# Patient Record
Sex: Female | Born: 1985 | Race: White | Hispanic: No | Marital: Married | Smoking: Never smoker
Health system: Southern US, Community
[De-identification: ages and names within clinical notes are randomized; demographics above are authoritative.]

## PROBLEM LIST (undated history)

## (undated) ENCOUNTER — Inpatient Hospital Stay (HOSPITAL_COMMUNITY): Payer: Self-pay

## (undated) DIAGNOSIS — R519 Headache, unspecified: Secondary | ICD-10-CM

## (undated) DIAGNOSIS — Z87738 Personal history of other specified (corrected) congenital malformations of digestive system: Secondary | ICD-10-CM

## (undated) DIAGNOSIS — N7013 Chronic salpingitis and oophoritis: Secondary | ICD-10-CM

## (undated) DIAGNOSIS — F32A Depression, unspecified: Secondary | ICD-10-CM

## (undated) DIAGNOSIS — N856 Intrauterine synechiae: Secondary | ICD-10-CM

## (undated) DIAGNOSIS — F329 Major depressive disorder, single episode, unspecified: Secondary | ICD-10-CM

## (undated) DIAGNOSIS — Z8719 Personal history of other diseases of the digestive system: Secondary | ICD-10-CM

## (undated) DIAGNOSIS — R51 Headache: Secondary | ICD-10-CM

## (undated) HISTORY — PX: COLON SURGERY: SHX602

---

## 2013-01-03 ENCOUNTER — Encounter (HOSPITAL_COMMUNITY): Payer: Self-pay | Admitting: Emergency Medicine

## 2013-01-03 ENCOUNTER — Emergency Department (INDEPENDENT_AMBULATORY_CARE_PROVIDER_SITE_OTHER)
Admission: EM | Admit: 2013-01-03 | Discharge: 2013-01-03 | Disposition: A | Discharge status: Home / Self Care | Payer: BC Managed Care – PPO | Source: Home / Self Care

## 2013-01-03 DIAGNOSIS — B349 Viral infection, unspecified: Secondary | ICD-10-CM

## 2013-01-03 DIAGNOSIS — I951 Orthostatic hypotension: Secondary | ICD-10-CM

## 2013-01-03 DIAGNOSIS — B9789 Other viral agents as the cause of diseases classified elsewhere: Secondary | ICD-10-CM

## 2013-01-03 LAB — CBC
Hemoglobin: 14.2 g/dL (ref 12.0–15.0)
MCH: 32.3 pg (ref 26.0–34.0)
MCHC: 35.4 g/dL (ref 30.0–36.0)
Platelets: 176 10*3/uL (ref 150–400)
RBC: 4.39 MIL/uL (ref 3.87–5.11)

## 2013-01-03 LAB — POCT INFECTIOUS MONO SCREEN: Mono Screen: NEGATIVE

## 2013-01-03 MED ORDER — SODIUM CHLORIDE 0.9 % IV BOLUS (SEPSIS): 1000.0000 mL | Freq: Once | INTRAVENOUS | Status: DC

## 2013-01-03 NOTE — ED Notes (Signed)
I-stat results not crossing over results given to Dr Denyse Amass

## 2013-01-03 NOTE — ED Provider Notes (Signed)
Audrey Vega is a 27 y.o. female who presents to Urgent Care today for sore throat fatigue and bilateral ear pain starting this morning. Patient had some mild sore throat starting yesterday. She denies any chest pain or trouble breathing nausea vomiting or diarrhea. She feels well otherwise. She denies any syncope or dizziness. Positive sick contacts at work. No tick bites.    PMH reviewed. Reflux History  Substance Use Topics  . Smoking status: Not on file  . Smokeless tobacco: Not on file  . Alcohol Use: Not on file   ROS as above Medications reviewed. Current Facility-Administered Medications  Medication Dose Route Frequency Provider Last Rate Last Dose  . sodium chloride 0.9 % bolus 1,000 mL  1,000 mL Intravenous Once Rodolph Bong, MD       Current Outpatient Prescriptions  Medication Sig Dispense Refill  . RANITIDINE HCL PO Take by mouth.        Exam:  BP 112/77  Pulse 128  Temp(Src) 99.7 F (37.6 C) (Oral)  Resp 18  SpO2 100%  LMP 12/09/2012 Filed Vitals:   01/03/13 1647 01/03/13 1744 01/03/13 1745 01/03/13 1746  BP: 106/73 103/67 106/70 112/77  Pulse: 87 75 105 128  Temp: 99.7 F (37.6 C)     TempSrc: Oral     Resp: 18     SpO2: 100%       Gen: Well NAD HEENT: EOMI,  MMM Lungs: CTABL Nl WOB Heart: RRR no MRG Abd: NABS, NT, ND Exts: Non edematous BL  LE, warm and well perfused.   Results for orders placed during the hospital encounter of 01/03/13 (from the past 24 hour(s))  POCT RAPID STREP A ([facility] ONLY)     Status: None   Collection Time    01/03/13  5:03 PM      Result Value Range   Streptococcus, Group A Screen (Direct) NEGATIVE  NEGATIVE  POCT INFECTIOUS MONO SCREEN     Status: None   Collection Time    01/03/13  5:40 PM      Result Value Range   Mono Screen NEGATIVE  NEGATIVE  CBC     Status: None   Collection Time    01/03/13  5:58 PM      Result Value Range   WBC 7.7  4.0 - 10.5 K/uL   RBC 4.39  3.87 - 5.11 MIL/uL   Hemoglobin  14.2  12.0 - 15.0 g/dL   HCT 78.2  95.6 - 21.3 %   MCV 91.3  78.0 - 100.0 fL   MCH 32.3  26.0 - 34.0 pg   MCHC 35.4  30.0 - 36.0 g/dL   RDW 08.6  57.8 - 46.9 %   Platelets 176  150 - 400 K/uL   No results found.  Assessment and Plan: 27 y.o. female with viral illness and orthostasis.  Unclear etiology.  Patient appears well and her physical exam is within normal limits with the exception of orthostatic vital signs. Her laboratory findings have also been normal. We attempted to provide 1 L of normal saline however unable to start an IV after 3 attempts.  Plan for symptomatic management with Tylenol and oral rehydration.  Followup if not improving Discussed warning signs or symptoms. Please see discharge instructions. Patient expresses understanding.     Rodolph Bong, MD 01/03/13 [phone]

## 2013-01-03 NOTE — ED Notes (Signed)
C/o ear pain and fatigue.   Patient states she feels very weak which started today.  Patient states she has cold symptoms.

## 2013-01-03 NOTE — ED Notes (Signed)
Attempted IV start x 2 W/o success

## 2013-01-05 LAB — CULTURE, GROUP A STREP

## 2013-01-06 LAB — POCT I-STAT, CHEM 8
Creatinine, Ser: 0.9 mg/dL (ref 0.50–1.10)
Hemoglobin: 14.6 g/dL (ref 12.0–15.0)
Potassium: 4.3 mEq/L (ref 3.5–5.1)
Sodium: 140 mEq/L (ref 135–145)
TCO2: 23 mmol/L (ref 0–100)

## 2013-08-16 ENCOUNTER — Encounter (HOSPITAL_COMMUNITY): Payer: Self-pay | Admitting: Emergency Medicine

## 2013-08-16 ENCOUNTER — Emergency Department (HOSPITAL_COMMUNITY)
Admission: EM | Admit: 2013-08-16 | Discharge: 2013-08-16 | Disposition: A | Discharge status: Home / Self Care | Payer: BC Managed Care – PPO | Attending: Emergency Medicine | Admitting: Emergency Medicine

## 2013-08-16 DIAGNOSIS — Y93G1 Activity, food preparation and clean up: Secondary | ICD-10-CM | POA: Insufficient documentation

## 2013-08-16 DIAGNOSIS — T07XXXA Unspecified multiple injuries, initial encounter: Secondary | ICD-10-CM

## 2013-08-16 DIAGNOSIS — Y92009 Unspecified place in unspecified non-institutional (private) residence as the place of occurrence of the external cause: Secondary | ICD-10-CM | POA: Insufficient documentation

## 2013-08-16 DIAGNOSIS — S61209A Unspecified open wound of unspecified finger without damage to nail, initial encounter: Secondary | ICD-10-CM | POA: Insufficient documentation

## 2013-08-16 DIAGNOSIS — W268XXA Contact with other sharp object(s), not elsewhere classified, initial encounter: Secondary | ICD-10-CM | POA: Insufficient documentation

## 2013-08-16 MED ORDER — OXYCODONE-ACETAMINOPHEN 5-325 MG PO TABS
1.0000 | ORAL_TABLET | Freq: Once | ORAL | Status: AC
  Administered 2013-08-16: 1 via ORAL
  Filled 2013-08-16: qty 1

## 2013-08-16 MED ORDER — IBUPROFEN 800 MG PO TABS: 800.0000 mg | ORAL_TABLET | Freq: Three times a day (TID) | ORAL | Status: DC

## 2013-08-16 MED ORDER — BACITRACIN 500 UNIT/GM EX OINT
1.0000 "application " | TOPICAL_OINTMENT | Freq: Two times a day (BID) | CUTANEOUS | Status: DC
  Administered 2013-08-16: 1 via TOPICAL
  Filled 2013-08-16 (×2): qty 0.9

## 2013-08-16 MED ORDER — HYDROCODONE-ACETAMINOPHEN 5-325 MG PO TABS: 1.0000 | ORAL_TABLET | ORAL | Status: DC | PRN

## 2013-08-16 NOTE — ED Provider Notes (Signed)
CSN: [id number]     Arrival date & time 08/16/13  1326 History  This chart was scribed for non-physician practitioner, Mellody DrownLauren Eldean Klatt, PA-C, working with Audrey PoundMichael Y. Oletta LamasGhim, MD by Audrey Vega, ED Scribe. This patient was seen in room WTR5/WTR5 and the patient's care was started at 2:00 PM.    Chief Complaint  Patient presents with  . Extremity Laceration   The history is provided by the patient. No language interpreter was used.   HPI Comments: Audrey Vega is a 28 y.o. female who presents to the Emergency Department complaining of a laceration to her right hand that occurred earlier today.  Pt states she was washing dishes when a glass broke and cut her right hand.  Pt has one laceration at the proximal aspect of right index finger and another laceration to the proximal aspect of the right thumb.  Pt can move her fingers with some pain.  Pt reports her tetanus is UTD.    History reviewed. No pertinent past medical history. No past surgical history on file. No family history on file. History  Substance Use Topics  . Smoking status: Never Smoker   . Smokeless tobacco: Not on file  . Alcohol Use: No   OB History   Grav Para Term Preterm Abortions TAB SAB Ect Mult Living                 Review of Systems  Constitutional: Negative for fever and chills.  Cardiovascular: Negative for chest pain.  Gastrointestinal: Negative for nausea, vomiting, abdominal pain and diarrhea.  Musculoskeletal: Negative for myalgias.  Skin: Positive for wound. Negative for color change and rash.  Psychiatric/Behavioral: Negative for behavioral problems and confusion.   Allergies  Review of patient's allergies indicates no known allergies.  Home Medications   Current Outpatient Rx  Name  Route  Sig  Dispense  Refill  . RANITIDINE HCL PO   Oral   Take by mouth.          Triage Vitals: BP 108/63  Pulse 87  Temp(Src) 98.2 F (36.8 C) (Oral)  Resp 16  SpO2 100%  LMP 08/05/2013  Physical  Exam  Nursing note and vitals reviewed. Constitutional: She is oriented to person, place, and time. She appears well-developed and well-nourished. No distress.  HENT:  Head: Normocephalic and atraumatic.  Eyes: EOM are normal.  Neck: Neck supple. No tracheal deviation present.  Cardiovascular: Normal rate.   Pulmonary/Chest: Effort normal. No respiratory distress.  Abdominal: Soft. She exhibits no distension.  Musculoskeletal: Normal range of motion.  Neurological: She is alert and oriented to person, place, and time.  Sensation and motor intact.    Skin: Skin is warm and dry.  2 cm linear Laceration to the posterior right MCP joint of index finger.  1 cm linear laceration to the posterior right MCP joint of right thumb.  Bleeding controlled.  Good ROM of all fingers.  Good strength.  Tendons intact.  Psychiatric: She has a normal mood and affect. Her behavior is normal.    ED Course  Procedures (including critical care time)  COORDINATION OF CARE: 2:04 PM-Will apply stitches to both areas.  Patient informed of current plan of treatment and evaluation and agrees with plan.    3:09 PM - Will apply steri strips and dressing.  Will order Bacitracin.      LACERATION REPAIR PROCEDURE NOTE The patient's identification was confirmed and consent was obtained. This procedure was performed by Mellody DrownLauren Makinzy Cleere, PA-C at 2:45 PM.  Site: Posterior right MCP joint of 2nd digit Sterile procedures observed: clean Anesthetic used (type and amt): 2% Lidocaine with epi, 2mL Suture type/size: 5-0 Prolene Length: 2 cm  # of Sutures: 3  Technique:simple interrupted Complexity:simple Antibx ointment applied: None Tetanus UTD Site anesthetized (local), irrigated with NS, explored without evidence of foreign body, wound well approximated, site covered with dry, sterile dressing.  Patient tolerated procedure well without complications. Instructions for care discussed verbally and patient provided  with additional written instructions for homecare and f/u.  LACERATION REPAIR PROCEDURE NOTE The patient's identification was confirmed and consent was obtained. This procedure was performed by Mellody Drown, PA-C at 2:45 PM. Site: Posterior right MCP joint of 1st digit Sterile procedures observed:clean Anesthetic used (type and amt): 2% Lidocaine with epi 2 mL Suture type/size:5-0 Prolene Length:1 cm  # of Sutures: 2 Technique:simple interrupted  Complexity: simple Antibx ointment applied: none Tetanus UTD Site anesthetized (local), irrigated with NS, explored without evidence of foreign body, wound well approximated, site covered with dry, sterile dressing.  Patient tolerated procedure well without complications. Instructions for care discussed verbally and patient provided with additional written instructions for homecare and f/u.    MDM   Final diagnoses:  Multiple lacerations   Pt with laceration to right hand. No tendon involvement, NV intact. Bleeding controlled.  Pt tolerated laceration repairs. Discussed treatment plan with the patient, will have follow up for suture removal in 8-10 days. Return precautions given. Reports understanding and no other concerns at this time.  Patient is stable for discharge at this time.   Meds given in ED:  Medications  bacitracin ointment 1 application (not administered)  oxyCODONE-acetaminophen (PERCOCET/ROXICET) 5-325 MG per tablet 1 tablet (1 tablet Oral Given 08/16/13 1515)    New Prescriptions   No medications on file     I personally performed the services described in this documentation, which was scribed in my presence. The recorded information has been reviewed and is accurate.      Audrey Seal, PA-C 08/18/13 [phone]

## 2013-08-16 NOTE — ED Notes (Signed)
She states she lac. Her right hand while washing a drinking glass, which broke.  She has a lac. At post. right mcp joint #1 and a second, somewhat larger one at post. mcp joint #2.  She is in no distress.

## 2013-08-16 NOTE — Discharge Instructions (Signed)
Please go to Dr. Trevor MacePower's office, an Urgent Care or return to the Emergency Department to have your sutures removed in 8-10 days from today. Return if Symptoms worsen.   Take medication as prescribed.  Keep your hand clean and dry.

## 2013-08-19 NOTE — ED Provider Notes (Signed)
Medical screening examination/treatment/procedure(s) were performed by non-physician practitioner and as supervising physician I was immediately available for consultation/collaboration.   EKG Interpretation None        Ahmet Schank Y. Ilisa Hayworth, MD 08/19/13 0835 

## 2013-10-23 ENCOUNTER — Other Ambulatory Visit: Payer: Self-pay | Admitting: Internal Medicine

## 2013-10-23 DIAGNOSIS — R1084 Generalized abdominal pain: Secondary | ICD-10-CM

## 2013-10-29 ENCOUNTER — Inpatient Hospital Stay: Admission: RE | Admit: 2013-10-29 | Payer: BC Managed Care – PPO | Source: Ambulatory Visit

## 2013-12-02 ENCOUNTER — Encounter (HOSPITAL_BASED_OUTPATIENT_CLINIC_OR_DEPARTMENT_OTHER): Payer: Self-pay | Admitting: *Deleted

## 2013-12-02 NOTE — Progress Notes (Signed)
NPO AFTER MN. ARRIVE AT 0930. NEEDS HG AND URINE PREG.  

## 2013-12-09 ENCOUNTER — Encounter (HOSPITAL_BASED_OUTPATIENT_CLINIC_OR_DEPARTMENT_OTHER): Payer: Self-pay | Admitting: *Deleted

## 2013-12-09 ENCOUNTER — Ambulatory Visit (HOSPITAL_BASED_OUTPATIENT_CLINIC_OR_DEPARTMENT_OTHER): Payer: BC Managed Care – PPO | Admitting: Anesthesiology

## 2013-12-09 ENCOUNTER — Encounter (HOSPITAL_BASED_OUTPATIENT_CLINIC_OR_DEPARTMENT_OTHER): Payer: BC Managed Care – PPO | Admitting: Anesthesiology

## 2013-12-09 ENCOUNTER — Encounter (HOSPITAL_BASED_OUTPATIENT_CLINIC_OR_DEPARTMENT_OTHER)
Admission: RE | Disposition: A | Discharge status: Home / Self Care | Payer: Self-pay | Source: Ambulatory Visit | Attending: Obstetrics and Gynecology

## 2013-12-09 ENCOUNTER — Ambulatory Visit (HOSPITAL_BASED_OUTPATIENT_CLINIC_OR_DEPARTMENT_OTHER)
Admission: RE | Admit: 2013-12-09 | Discharge: 2013-12-09 | Disposition: A | Discharge status: Home / Self Care | Payer: BC Managed Care – PPO | Source: Ambulatory Visit | Attending: Obstetrics and Gynecology | Admitting: Obstetrics and Gynecology

## 2013-12-09 DIAGNOSIS — N979 Female infertility, unspecified: Secondary | ICD-10-CM | POA: Insufficient documentation

## 2013-12-09 DIAGNOSIS — N736 Female pelvic peritoneal adhesions (postinfective): Secondary | ICD-10-CM | POA: Insufficient documentation

## 2013-12-09 DIAGNOSIS — N7013 Chronic salpingitis and oophoritis: Secondary | ICD-10-CM | POA: Insufficient documentation

## 2013-12-09 DIAGNOSIS — Z9049 Acquired absence of other specified parts of digestive tract: Secondary | ICD-10-CM | POA: Insufficient documentation

## 2013-12-09 HISTORY — DX: Intrauterine synechiae: N85.6

## 2013-12-09 HISTORY — DX: Personal history of other specified (corrected) congenital malformations of digestive system: Z87.738

## 2013-12-09 HISTORY — DX: Chronic salpingitis and oophoritis: N70.13

## 2013-12-09 HISTORY — DX: Personal history of other diseases of the digestive system: Z87.19

## 2013-12-09 HISTORY — PX: LAPAROSCOPY: SHX197

## 2013-12-09 LAB — POCT PREGNANCY, URINE: PREG TEST UR: NEGATIVE

## 2013-12-09 LAB — POCT HEMOGLOBIN-HEMACUE: Hemoglobin: 15.3 g/dL — ABNORMAL HIGH (ref 12.0–15.0)

## 2013-12-09 SURGERY — LAPAROSCOPY OPERATIVE
Anesthesia: General | Site: Uterus

## 2013-12-09 MED ORDER — LACTATED RINGERS IV SOLN
INTRAVENOUS | Status: DC
  Administered 2013-12-09: 10:00:00 via INTRAVENOUS
  Filled 2013-12-09: qty 1000

## 2013-12-09 MED ORDER — MIDAZOLAM HCL 2 MG/2ML IJ SOLN
INTRAMUSCULAR | Status: AC
  Filled 2013-12-09: qty 2

## 2013-12-09 MED ORDER — FENTANYL CITRATE 0.05 MG/ML IJ SOLN
100.0000 ug | Freq: Once | INTRAMUSCULAR | Status: AC
  Administered 2013-12-09: 100 ug via INTRAVENOUS
  Filled 2013-12-09: qty 2

## 2013-12-09 MED ORDER — FENTANYL CITRATE 0.05 MG/ML IJ SOLN
INTRAMUSCULAR | Status: AC
  Filled 2013-12-09: qty 2

## 2013-12-09 MED ORDER — KETOROLAC TROMETHAMINE 30 MG/ML IJ SOLN
15.0000 mg | Freq: Once | INTRAMUSCULAR | Status: DC | PRN
  Filled 2013-12-09: qty 1

## 2013-12-09 MED ORDER — ONDANSETRON HCL 4 MG/2ML IJ SOLN
INTRAMUSCULAR | Status: DC | PRN
  Administered 2013-12-09: 4 mg via INTRAVENOUS

## 2013-12-09 MED ORDER — ONDANSETRON HCL 4 MG PO TABS: 4.0000 mg | ORAL_TABLET | Freq: Three times a day (TID) | ORAL | Status: DC | PRN

## 2013-12-09 MED ORDER — FENTANYL CITRATE 0.05 MG/ML IJ SOLN
INTRAMUSCULAR | Status: AC
  Filled 2013-12-09: qty 6

## 2013-12-09 MED ORDER — OXYCODONE-ACETAMINOPHEN 7.5-325 MG PO TABS: 1.0000 | ORAL_TABLET | ORAL | Status: DC | PRN

## 2013-12-09 MED ORDER — PROPOFOL 10 MG/ML IV BOLUS
INTRAVENOUS | Status: DC | PRN
  Administered 2013-12-09: 200 mg via INTRAVENOUS

## 2013-12-09 MED ORDER — MIDAZOLAM HCL 5 MG/5ML IJ SOLN
INTRAMUSCULAR | Status: DC | PRN
  Administered 2013-12-09: 2 mg via INTRAVENOUS

## 2013-12-09 MED ORDER — FENTANYL CITRATE 0.05 MG/ML IJ SOLN
INTRAMUSCULAR | Status: DC | PRN
  Administered 2013-12-09 (×5): 25 ug via INTRAVENOUS
  Administered 2013-12-09: 50 ug via INTRAVENOUS
  Administered 2013-12-09: 25 ug via INTRAVENOUS

## 2013-12-09 MED ORDER — GLYCOPYRROLATE 0.2 MG/ML IJ SOLN
INTRAMUSCULAR | Status: DC | PRN
  Administered 2013-12-09: 0.2 mg via INTRAVENOUS

## 2013-12-09 MED ORDER — SUCCINYLCHOLINE CHLORIDE 20 MG/ML IJ SOLN
INTRAMUSCULAR | Status: DC | PRN
  Administered 2013-12-09: 100 mg via INTRAVENOUS

## 2013-12-09 MED ORDER — ACETAMINOPHEN 10 MG/ML IV SOLN
INTRAVENOUS | Status: DC | PRN
  Administered 2013-12-09: 1000 mg via INTRAVENOUS

## 2013-12-09 MED ORDER — KETOROLAC TROMETHAMINE 30 MG/ML IJ SOLN
INTRAMUSCULAR | Status: DC | PRN
  Administered 2013-12-09: 30 mg via INTRAVENOUS

## 2013-12-09 MED ORDER — LACTATED RINGERS IR SOLN
Status: DC | PRN
  Administered 2013-12-09: 3000 mL

## 2013-12-09 MED ORDER — CEFAZOLIN SODIUM-DEXTROSE 2-3 GM-% IV SOLR
INTRAVENOUS | Status: AC
  Filled 2013-12-09: qty 50

## 2013-12-09 MED ORDER — LETROZOLE 2.5 MG PO TABS: 7.5000 mg | ORAL_TABLET | Freq: Every day | ORAL | Status: DC

## 2013-12-09 MED ORDER — OXYCODONE HCL 5 MG PO TABS
7.5000 mg | ORAL_TABLET | Freq: Once | ORAL | Status: AC
  Administered 2013-12-09: 7.5 mg via ORAL
  Filled 2013-12-09: qty 2

## 2013-12-09 MED ORDER — OXYCODONE HCL 5 MG PO TABS
ORAL_TABLET | ORAL | Status: AC
  Filled 2013-12-09: qty 2

## 2013-12-09 MED ORDER — DEXAMETHASONE SODIUM PHOSPHATE 4 MG/ML IJ SOLN
INTRAMUSCULAR | Status: DC | PRN
  Administered 2013-12-09: 4 mg via INTRAVENOUS

## 2013-12-09 MED ORDER — PROMETHAZINE HCL 25 MG/ML IJ SOLN
6.2500 mg | INTRAMUSCULAR | Status: DC | PRN
  Filled 2013-12-09: qty 1

## 2013-12-09 MED ORDER — METHYLENE BLUE 1 % INJ SOLN
INTRAMUSCULAR | Status: DC | PRN
  Administered 2013-12-09: 10 mL

## 2013-12-09 MED ORDER — BUPIVACAINE-EPINEPHRINE 0.25% -1:200000 IJ SOLN
INTRAMUSCULAR | Status: DC | PRN
  Administered 2013-12-09: 8 mL

## 2013-12-09 MED ORDER — CEFAZOLIN SODIUM-DEXTROSE 2-3 GM-% IV SOLR
2.0000 g | INTRAVENOUS | Status: AC
  Administered 2013-12-09: 2 g via INTRAVENOUS
  Filled 2013-12-09: qty 50

## 2013-12-09 MED ORDER — LACTATED RINGERS IV SOLN
INTRAVENOUS | Status: DC | PRN
Start: 2013-12-09 — End: 2013-12-09
  Administered 2013-12-09 (×2): via INTRAVENOUS

## 2013-12-09 MED ORDER — FENTANYL CITRATE 0.05 MG/ML IJ SOLN
25.0000 ug | INTRAMUSCULAR | Status: DC | PRN
  Administered 2013-12-09 (×2): 50 ug via INTRAVENOUS
  Filled 2013-12-09: qty 1

## 2013-12-09 MED ORDER — LIDOCAINE HCL (CARDIAC) 20 MG/ML IV SOLN
INTRAVENOUS | Status: DC | PRN
  Administered 2013-12-09: 60 mg via INTRAVENOUS
  Administered 2013-12-09: 40 mg via INTRAVENOUS

## 2013-12-09 SURGICAL SUPPLY — 45 items
BAG URINE DRAINAGE (UROLOGICAL SUPPLIES) ×3 IMPLANT
BLADE SURG 15 STRL LF DISP TIS (BLADE) ×1 IMPLANT
BLADE SURG 15 STRL SS (BLADE) ×2
CATH FOLEY 2WAY SLVR  5CC 14FR (CATHETERS) ×2
CATH FOLEY 2WAY SLVR 5CC 14FR (CATHETERS) ×1 IMPLANT
COVER MAYO STAND STRL (DRAPES) ×3 IMPLANT
DERMABOND ADVANCED (GAUZE/BANDAGES/DRESSINGS) ×2
DERMABOND ADVANCED .7 DNX12 (GAUZE/BANDAGES/DRESSINGS) ×1 IMPLANT
DRAPE CAMERA CLOSED 9X96 (DRAPES) ×3 IMPLANT
ELECT REM PT RETURN 9FT ADLT (ELECTROSURGICAL) ×3
ELECTRODE REM PT RTRN 9FT ADLT (ELECTROSURGICAL) ×1 IMPLANT
GLOVE BIO SURGEON STRL SZ 6.5 (GLOVE) ×2 IMPLANT
GLOVE BIO SURGEON STRL SZ8 (GLOVE) ×3 IMPLANT
GLOVE BIO SURGEONS STRL SZ 6.5 (GLOVE) ×1
GLOVE BIOGEL PI IND STRL 8.5 (GLOVE) ×1 IMPLANT
GLOVE BIOGEL PI INDICATOR 8.5 (GLOVE) ×2
GLOVE INDICATOR 6.5 STRL GRN (GLOVE) ×3 IMPLANT
GOWN STRL REUS W/ TWL XL LVL3 (GOWN DISPOSABLE) ×2 IMPLANT
GOWN STRL REUS W/TWL XL LVL3 (GOWN DISPOSABLE) ×4
IV LACTATED RINGER IRRG 3000ML (IV SOLUTION) ×2
IV LR IRRIG 3000ML ARTHROMATIC (IV SOLUTION) ×1 IMPLANT
MANIPULATOR UTERINE 4.5 ZUMI (MISCELLANEOUS) ×3 IMPLANT
NEEDLE HYPO 25X1 1.5 SAFETY (NEEDLE) ×3 IMPLANT
NEEDLE INSUFFLATION 14GA 120MM (NEEDLE) ×3 IMPLANT
NS IRRIG 500ML POUR BTL (IV SOLUTION) ×3 IMPLANT
PACK BASIN DAY SURGERY FS (CUSTOM PROCEDURE TRAY) ×3 IMPLANT
PACK LAPAROSCOPY II (CUSTOM PROCEDURE TRAY) ×3 IMPLANT
PAD OB MATERNITY 4.3X12.25 (PERSONAL CARE ITEMS) ×3 IMPLANT
PENCIL BUTTON HOLSTER BLD 10FT (ELECTRODE) ×3 IMPLANT
SCALPEL HARMONIC ACE (MISCELLANEOUS) ×3 IMPLANT
SEPRAFILM MEMBRANE 5X6 (MISCELLANEOUS) ×6 IMPLANT
SET IRRIG TUBING LAPAROSCOPIC (IRRIGATION / IRRIGATOR) ×3 IMPLANT
SOLUTION ANTI FOG 6CC (MISCELLANEOUS) ×3 IMPLANT
SUT MNCRL AB 4-0 PS2 18 (SUTURE) ×3 IMPLANT
SYR 5ML LL (SYRINGE) ×3 IMPLANT
SYR CONTROL 10ML LL (SYRINGE) ×3 IMPLANT
SYRINGE 12CC LL (MISCELLANEOUS) ×3 IMPLANT
SYRINGE 20CC LL (MISCELLANEOUS) ×3 IMPLANT
SYRINGE 60CC LL (MISCELLANEOUS) ×3 IMPLANT
TOWEL OR 17X24 6PK STRL BLUE (TOWEL DISPOSABLE) ×6 IMPLANT
TRAY DSU PREP LF (CUSTOM PROCEDURE TRAY) ×3 IMPLANT
TROCAR OPTI TIP 5M 100M (ENDOMECHANICALS) ×12 IMPLANT
TUBING INSUFFLATION 10FT LAP (TUBING) ×3 IMPLANT
WARMER LAPAROSCOPE (MISCELLANEOUS) ×3 IMPLANT
WATER STERILE IRR 500ML POUR (IV SOLUTION) ×3 IMPLANT

## 2013-12-09 NOTE — Anesthesia Procedure Notes (Signed)
Procedure Name: Intubation Date/Time: 12/09/2013 1:15 PM Performed by: Jessica PriestBEESON, Demetra Moya C Pre-anesthesia Checklist: Patient identified, Emergency Drugs available, Suction available and Patient being monitored Patient Re-evaluated:Patient Re-evaluated prior to inductionOxygen Delivery Method: Circle System Utilized Preoxygenation: Pre-oxygenation with 100% oxygen Intubation Type: IV induction Ventilation: Mask ventilation without difficulty Laryngoscope Size: Mac and 4 Grade View: Grade II Tube type: Oral Tube size: 7.0 mm Number of attempts: 1 Airway Equipment and Method: stylet and oral airway Placement Confirmation: ETT inserted through vocal cords under direct vision,  positive ETCO2 and breath sounds checked- equal and bilateral Secured at: 24 cm Tube secured with: Tape Dental Injury: Teeth and Oropharynx as per pre-operative assessment

## 2013-12-09 NOTE — Anesthesia Postprocedure Evaluation (Signed)
  Anesthesia Post-op Note  Patient: Audrey Vega  Procedure(s) Performed: Procedure(s) (LRB): LAPAROSCOPY LYSIS OF ADHESIONS, right partial SALPINGECTOMY, left fimbrioplasty, enterolysis (N/A)  Patient Location: PACU  Anesthesia Type: General  Level of Consciousness: awake and alert   Airway and Oxygen Therapy: Patient Spontanous Breathing  Post-op Pain: mild  Post-op Assessment: Post-op Vital signs reviewed, Patient's Cardiovascular Status Stable, Respiratory Function Stable, Patent Airway and No signs of Nausea or vomiting  Last Vitals:  Filed Vitals:   12/09/13 1600  BP: 103/60  Pulse: 75  Temp:   Resp: 14    Post-op Vital Signs: stable   Complications: No apparent anesthesia complications

## 2013-12-09 NOTE — Transfer of Care (Signed)
Immediate Anesthesia Transfer of Care Note  Patient: Audrey Vega  Procedure(s) Performed: Procedure(s) (LRB): LAPAROSCOPY LYSIS OF ADHESIONS, right partial SALPINGECTOMY, left fimbrioplasty, enterolysis (N/A)  Patient Location: PACU  Anesthesia Type: General  Level of Consciousness: awake, sedated, patient cooperative and responds to stimulation  Airway & Oxygen Therapy: Patient Spontanous Breathing and Patient connected to face mask oxygen  Post-op Assessment: Report given to PACU RN, Post -op Vital signs reviewed and stable and Patient moving all extremities  Post vital signs: Reviewed and stable  Complications: No apparent anesthesia complications

## 2013-12-09 NOTE — Op Note (Signed)
Operative Note  Preoperative diagnosis: Right hydrosalpinx, pelvic pain, infertility  Postoperative diagnosis: Right hydrosalpinx, left paratubal adhesions, extensive abdominal and pelvic adhesions  Procedure: Laparoscopy, lysis of adhesions and enterolysis, removal of right hydrosalpinx, left fimbrioplasty, chromopertubation  Anesthesia: Gen. endotracheal  Complications: None  Estimated blood loss: <10 cc  Specimens: Portion of right hydrosalpinx to pathology  Findings: Unexamined anesthesia or cervix and corpus were high in the pelvis. The uterus sounded to 11 cm. It was of normal size on bimanual exam. There were no adnexal masses or posterior cul-de-sac induration. Rectal exam was confirmatory. On laparoscopy, liver edge and gallbladder were normal. The appendix was surgically absent. Patient was status post colectomy for Hirschsprung's disease therefore a small bowel had been pulled down to be exteriorized through the anal canal. She had extensive adhesions in the lower abdomen some of which contained loops of small bowel. These were taken down with meticulous blunt and needle tip electrode sharp electrosurgical dissection. There were also midline upper bowel adhesions, which were not taken down. The adhesiolysis and the enterolysis in the lower abdomen was necessary for successful completion of the procedure and took approximately 45 minutes.  The uterus and anterior cul-de-sac was grossly normal. The right tube was massively enlarged to 6 x 6 cm a distal obstruction and was cohesively adherent to the pelvic sidewall and deep into the posterior cul-de-sac, thereby covering the ovary such that even after lysis of some of the adhesions the right ovary was not identified. The left ovary was densely adherent with 30% of the surface area and was pulled upwards via adhesions to the small bowel and to the iliac fossa. Some adhesions on the lateral aspect of the ovary to the iliac fossa and created  a compartment which also displaced the left fallopian tube up were its and lateral, completely away from the surface of the ovary. This filmy compartment was entered after layers of adhesiolyse and the fimbriated end of the tube could be now visualized. It was rated as 4/5. It was patent to chromotubation.  Description of the procedure: The patient was placed in dorsal supine position and general endotracheal anesthesia was given. 2 g of cefazolin were given intravenously for prophylaxis. Patient was placed in lithotomy position. She was prepped and draped  in sterile  manner.  The bladder was catheterized with a Foley catheter . A ZUMI catheter was placed into the uterine cavity. The surgeon was regloved and a surgical field was created on the abdomen.  After preemptive anesthesia of all surgical sites with 0.25% bupivacaine with 1 200,000 epinephrine, a 5 mm supraumbilical skin incision was made and a Verress needle was inserted. Its correct location was confirmed. A pneumoperitoneum was created with carbon dioxide.  5 mm laparoscope with a 30 lens was inserted and video laparoscopy was started . A left lower quadrant 5 mm and a right lower quadrant  5 mm incisions were made and ancillary trochars were placed under direct visualization. Above findings were noted. An additional 5 mm port was placed at the right paraumbilical location in order to perform a safe enterolysis for the midline small bowel adhesions.  Using a needle electrode on 35 W of cutting current, meticulous lysis of adhesions and enterolysis, both sharp and blunt, was performed extensive lower abdominal adhesions and pelvic adhesions. This part of the surgery was necessary to perform in order to complete the operation and it took 45 minutes.  Next using the same technique the left oophorolysis was performed. The  ovary was allowed to remain suspended in this normal anatomic location in the true pelvis next the. Tubal adhesions separating  the distal end of the left tube from the ovary were lysed and the fimbria were released from their constricted and stretched condition by lysis of surrounding adhesions. The chromotubation was performed and tube was patent.  We then turned our attention to the right hydrosalpinx which was encased with adhesion tissue in its entirety. After identification of the plains and the small bowel that was functioning as her rectosigmoid, about 50% of the tube was freed up. On the remaining 50 percent which was facing the pelvic sidewall, the adhesions were cohesive and also covering the ovary which was not visible. Therefore decision was made not to remove the entire tube and only the proximal half. This was performed by applying harmonic ACE sequentially on the mesosalpinx after transecting the tube at its uterotubal junction. The proximal half of the tube was submitted to pathology.  Pelvis was copiously irrigated and aspirated. Hemostasis was insured. A slurry of Seprafilm from homogenizing 2 large sheets in 50 mL of lactated Ringer was instilled into the pelvis as an adhesion barrier.  The instruments were removed. The gas was allowed to escape. The incisions were approximated with Dermabond.  The patient tolerated the procedure well and was transferred to recovery room in satisfactory condition. She will use Femara 7.5 mg days 3 through 7 after her next cycle. I will do a day 12 ultrasound in my office.  Fermin SchwabYALCINKAYA,Kierra Jezewski

## 2013-12-09 NOTE — Anesthesia Preprocedure Evaluation (Addendum)
Anesthesia Evaluation  Patient identified by MRN, date of birth, ID band Patient awake    Reviewed: Allergy & Precautions, H&P , NPO status , Patient's Chart, lab work & pertinent test results  History of Anesthesia Complications (+) history of anesthetic complications (Hx of failed spinal x2 attempts during C-section with conversion to GETA)  Airway Mallampati: I TM Distance: >3 FB Neck ROM: Full    Dental  (+) Teeth Intact, Dental Advisory Given   Pulmonary neg pulmonary ROS,  breath sounds clear to auscultation        Cardiovascular negative cardio ROS  Rhythm:Regular Rate:Normal     Neuro/Psych negative neurological ROS  negative psych ROS   GI/Hepatic negative GI ROS, Neg liver ROS,   Endo/Other  Morbid obesity  Renal/GU negative Renal ROS     Musculoskeletal negative musculoskeletal ROS (+)   Abdominal   Peds  Hematology negative hematology ROS (+)   Anesthesia Other Findings   Reproductive/Obstetrics                          Anesthesia Physical Anesthesia Plan  ASA: II  Anesthesia Plan: General   Post-op Pain Management:    Induction: Intravenous  Airway Management Planned: Oral ETT  Additional Equipment: None  Intra-op Plan:   Post-operative Plan: Extubation in OR  Informed Consent: I have reviewed the patients History and Physical, chart, labs and discussed the procedure including the risks, benefits and alternatives for the proposed anesthesia with the patient or authorized representative who has indicated his/her understanding and acceptance.   Dental advisory given  Plan Discussed with: CRNA and Surgeon  Anesthesia Plan Comments:         Anesthesia Quick Evaluation

## 2013-12-09 NOTE — Discharge Instructions (Signed)
HOME CARE INSTRUCTIONS - LAPAROSCOPY ° °Wound Care: The bandaids or dressing which are placed over the skin openings may be removed the day after surgery. The incision should be kept clean and dry. The stitches do not need to be removed. Should the incision become sore, red, and swollen after the first week, check with your doctor. ° °Personal Hygiene: Shower the day after your procedure. Always wipe from front to back after elimination.  ° °Activity: Do not drive or operate any equipment today. The effects of the anesthesia are still present and drowsiness may result. Rest today, not necessarily flat bed rest, just take it easy. You may resume your normal activity in one to three days or as instructed by your physician. ° °Sexual Activity: You resume sexual activity as indicated by your physician_________. °If your laparoscopy was for a sterilization ( tubes tied ), continue current method of birth control until after your next period or ask for specific instructions from your doctor. ° °Diet: Eat a light diet as desired this evening. You may resume a regular diet tomorrow. ° °Return to Work: Two to three days or as indicated by your doctor. ° °Expectations °After Surgery: Your surgery will cause vaginal drainage or spotting which may continue for 2-3 days. Mild abdominal discomfort or tenderness is not unusual and some shoulder pain may also be noted which can be relieved by lying flat in pain. ° °Call Your Doctor °If these Occur:  Persistent or heavy bleeding at incision site °      Redness or swelling around incision °      Elevation of temperature greater than 100 degrees F ° °Call for follow-up appointment  ° ° °Post Anesthesia Home Care Instructions ° °Activity: °Get plenty of rest for the remainder of the day. A responsible adult should stay with you for 24 hours following the procedure.  °For the next 24 hours, DO NOT: °-Drive a car °-Operate machinery °-Drink alcoholic beverages °-Take any medication  unless instructed by your physician °-Make any legal decisions or sign important papers. ° °Meals: °Start with liquid foods such as gelatin or soup. Progress to regular foods as tolerated. Avoid greasy, spicy, heavy foods. If nausea and/or vomiting occur, drink only clear liquids until the nausea and/or vomiting subsides. Call your physician if vomiting continues. ° °Special Instructions/Symptoms: °Your throat may feel dry or sore from the anesthesia or the breathing tube placed in your throat during surgery. If this causes discomfort, gargle with warm salt water. The discomfort should disappear within 24 hours. ° °

## 2013-12-09 NOTE — H&P (Signed)
Audrey Vega is a 28 y.o. female gravida 1 para 1, originally referred to me by Dr. 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 She was able to conceive in 2008 after one year of trying and had a cesarean delivery. She does not recall being told any abnormal findings during the cesarean section. When she went through a secondary infertility workup the HSG showed dilated right tube with distal occlusion, suggestive of a right hydrosalpinx. She complains of cyclic pain around time of ovulation and with menses .  Patient would like to preserve her childbearing potential.  Pertinent Gynecological History: Menses: Light flow, monthly. LMP was 11/06/2013 Contraception: none DES exposure: denies Blood transfusions: none Sexually transmitted diseases: no past history Last pap: normal  OB History: 2009: Cesarean delivery   Menstrual History: Menarche age: [age] LMP: 11/06/2013    Past Medical History  Diagnosis Date  . Chronic salpingitis and oophoritis   . Intrauterine synechiae   . History of Hirschsprung's disease     INFANT --  S/P  SURGICAL REPAIR                    Past Surgical History  Procedure Laterality Date  . Colon surgery  INFANT    HIRSCHSPRUNG'S DISEASE  . Cesarean section  2009             History reviewed. No pertinent family history. No hereditary disease.  No cancer of breast, ovary, uterus. No cutaneous leiomyomatosis or renal cell carcinoma.  History   Social History  . Marital Status: Married    Spouse Name: N/A    Number of Children: N/A  . Years of Education: N/A   Occupational History  . Not on file.   Social History Main Topics  . Smoking status: Never Smoker   . Smokeless tobacco: Never Used  . Alcohol Use: Yes     Comment: OCCASIONAL  . Drug Use: No  . Sexual Activity: Not on file   Other Topics Concern  . Not on file   Social History  Narrative  . No narrative on file    No Known Allergies  No current facility-administered medications on file prior to encounter.   Current Outpatient Prescriptions on File Prior to Encounter  Medication Sig Dispense Refill  . vitamin C (ASCORBIC ACID) 500 MG tablet Take 500 mg by mouth daily.         Review of Systems  Constitutional: Negative.   HENT: Negative.   Eyes: Negative.   Respiratory: Negative.   Cardiovascular: Negative.   Gastrointestinal: Negative.   Genitourinary: Negative.   Musculoskeletal: Negative.   Skin: Negative.   Neurological: Negative.   Endo/Heme/Allergies: Negative.   Psychiatric/Behavioral: Negative.      Physical Exam  BP 106/66  Pulse 84  Temp(Src) 98 F (36.7 C) (Oral)  Resp 14  Ht 5\' 3"  (1.6 m)  Wt 86.183 kg (190 lb)  BMI 33.67 kg/m2  SpO2 100%  LMP 11/06/2013 Constitutional: She is oriented to person, place, and time. She appears well-developed and well-nourished.  HENT:  Head: Normocephalic and atraumatic.  Nose: Nose normal.  Mouth/Throat: Oropharynx is clear and moist. No oropharyngeal exudate.  Eyes: Conjunctivae normal and EOM are normal. Pupils are equal, round, and reactive to light. No scleral icterus.  Neck: Normal range of motion. Neck supple. No tracheal deviation present. No thyromegaly present.  Cardiovascular: Normal rate.   Respiratory:  Effort normal and breath sounds normal.  GI: Soft. Bowel sounds are normal. She exhibits no distension and no mass. There is no tenderness.  Lymphadenopathy:    She has no cervical adenopathy.  Neurological: She is alert and oriented to person, place, and time. She has normal reflexes.  Skin: Skin is warm.  Psychiatric: She has a normal mood and affect. Her behavior is normal. Judgment and thought content normal.    Assessment/Plan:  Right hydrosalpinx, etiology: Post surgical? Rule out pelvic endometriosis Secondary infertility Patient is scheduled to undergo laparoscopy,  lysis of adhesions, possible removal of right hydrosalpinx. I reviewed the benefits, risks, and expected consequences of the surgery in detail with the patient. All her questions were answered.   Fermin Schwab

## 2013-12-10 ENCOUNTER — Encounter (HOSPITAL_BASED_OUTPATIENT_CLINIC_OR_DEPARTMENT_OTHER): Payer: Self-pay | Admitting: Obstetrics and Gynecology

## 2014-03-08 DIAGNOSIS — N970 Female infertility associated with anovulation: Secondary | ICD-10-CM | POA: Insufficient documentation

## 2014-04-24 ENCOUNTER — Emergency Department (HOSPITAL_COMMUNITY): Payer: BC Managed Care – PPO

## 2014-04-24 ENCOUNTER — Encounter (HOSPITAL_COMMUNITY): Payer: Self-pay | Admitting: Adult Health

## 2014-04-24 ENCOUNTER — Emergency Department (HOSPITAL_COMMUNITY)
Admission: EM | Admit: 2014-04-24 | Discharge: 2014-04-24 | Disposition: A | Discharge status: Home / Self Care | Payer: BC Managed Care – PPO | Attending: Emergency Medicine | Admitting: Emergency Medicine

## 2014-04-24 DIAGNOSIS — R509 Fever, unspecified: Secondary | ICD-10-CM | POA: Diagnosis not present

## 2014-04-24 DIAGNOSIS — Z8742 Personal history of other diseases of the female genital tract: Secondary | ICD-10-CM | POA: Diagnosis not present

## 2014-04-24 DIAGNOSIS — R0789 Other chest pain: Secondary | ICD-10-CM | POA: Insufficient documentation

## 2014-04-24 DIAGNOSIS — R05 Cough: Secondary | ICD-10-CM | POA: Insufficient documentation

## 2014-04-24 DIAGNOSIS — M791 Myalgia: Secondary | ICD-10-CM | POA: Insufficient documentation

## 2014-04-24 DIAGNOSIS — R0981 Nasal congestion: Secondary | ICD-10-CM | POA: Diagnosis not present

## 2014-04-24 DIAGNOSIS — Z3A01 Less than 8 weeks gestation of pregnancy: Secondary | ICD-10-CM | POA: Diagnosis not present

## 2014-04-24 DIAGNOSIS — Z792 Long term (current) use of antibiotics: Secondary | ICD-10-CM | POA: Insufficient documentation

## 2014-04-24 DIAGNOSIS — Z8719 Personal history of other diseases of the digestive system: Secondary | ICD-10-CM | POA: Insufficient documentation

## 2014-04-24 DIAGNOSIS — Z79899 Other long term (current) drug therapy: Secondary | ICD-10-CM | POA: Diagnosis not present

## 2014-04-24 DIAGNOSIS — R1030 Lower abdominal pain, unspecified: Secondary | ICD-10-CM | POA: Diagnosis not present

## 2014-04-24 DIAGNOSIS — R059 Cough, unspecified: Secondary | ICD-10-CM

## 2014-04-24 DIAGNOSIS — O9989 Other specified diseases and conditions complicating pregnancy, childbirth and the puerperium: Secondary | ICD-10-CM | POA: Insufficient documentation

## 2014-04-24 LAB — URINALYSIS, ROUTINE W REFLEX MICROSCOPIC
Bilirubin Urine: NEGATIVE
GLUCOSE, UA: NEGATIVE mg/dL
KETONES UR: NEGATIVE mg/dL
Nitrite: NEGATIVE
PROTEIN: NEGATIVE mg/dL
Specific Gravity, Urine: 1.017 (ref 1.005–1.030)
Urobilinogen, UA: 0.2 mg/dL (ref 0.0–1.0)
pH: 6 (ref 5.0–8.0)

## 2014-04-24 LAB — URINE MICROSCOPIC-ADD ON

## 2014-04-24 LAB — POC URINE PREG, ED: PREG TEST UR: POSITIVE — AB

## 2014-04-24 MED ORDER — HYDROCOD POLST-CHLORPHEN POLST 10-8 MG/5ML PO LQCR
5.0000 mL | Freq: Once | ORAL | Status: AC
  Administered 2014-04-24: 5 mL via ORAL
  Filled 2014-04-24: qty 5

## 2014-04-24 MED ORDER — HYDROCOD POLST-CHLORPHEN POLST 10-8 MG/5ML PO LQCR: 5.0000 mL | Freq: Two times a day (BID) | ORAL | Status: DC | PRN

## 2014-04-24 MED ORDER — ALBUTEROL SULFATE (2.5 MG/3ML) 0.083% IN NEBU
5.0000 mg | INHALATION_SOLUTION | Freq: Once | RESPIRATORY_TRACT | Status: AC
  Administered 2014-04-24: 5 mg via RESPIRATORY_TRACT
  Filled 2014-04-24: qty 6

## 2014-04-24 MED ORDER — PREDNISONE 20 MG PO TABS: 60.0000 mg | ORAL_TABLET | Freq: Every day | ORAL | Status: DC

## 2014-04-24 NOTE — ED Provider Notes (Signed)
CSN: [id number]     Arrival date & time 04/24/14  1929 History   First MD Initiated Contact with Patient 04/24/14 2030     Chief Complaint  Patient presents with  . Abdominal Pain  . Fever  . Cough     (Consider location/radiation/quality/duration/timing/severity/associated sxs/prior Treatment) HPI Comments: Cough, congestion sinus pressure for 4 weeks. She has seen her doctor and has been on antibiotics without improvement. OTC medications also without relief. Fever has been intermittent.  She reports being approximately [redacted] weeks pregnant. She has c/o lower abdominal cramping for the past several weeks without new symptoms tonight. "I think I have a yeast infection" with mild vaginal itching. She denies vaginal bleeding, dysuria.   The history is provided by the patient. No language interpreter was used.    Past Medical History  Diagnosis Date  . Chronic salpingitis and oophoritis   . Intrauterine synechiae   . History of Hirschsprung's disease     INFANT --  S/P  SURGICAL REPAIR   Past Surgical History  Procedure Laterality Date  . Colon surgery  INFANT    HIRSCHSPRUNG'S DISEASE  . Cesarean section  2009  . Laparoscopy N/A 12/09/2013    Procedure: LAPAROSCOPY LYSIS OF ADHESIONS, right partial SALPINGECTOMY, left fimbrioplasty, enterolysis;  Surgeon: Fermin Schwabamer Yalcinkaya, MD;  [facility] [place];  Service: Gynecology;  Laterality: N/A;   History reviewed. No pertinent family history. History  Substance Use Topics  . Smoking status: Never Smoker   . Smokeless tobacco: Never Used  . Alcohol Use: Yes     Comment: OCCASIONAL   OB History    Gravida Para Term Preterm AB TAB SAB Ectopic Multiple Living   1              Review of Systems  Constitutional: Positive for fever. Negative for chills.  HENT: Positive for congestion and sinus pressure. Negative for trouble swallowing.   Respiratory: Positive for cough and chest tightness. Negative for shortness of  breath.   Cardiovascular: Negative.   Gastrointestinal: Negative.  Negative for abdominal pain.  Genitourinary: Negative for dysuria and vaginal bleeding.  Musculoskeletal: Positive for myalgias.  Skin: Negative.   Neurological: Negative.       Allergies  Other  Home Medications   Prior to Admission medications   Medication Sig Start Date End Date Taking? Authorizing Provider  acetaminophen (TYLENOL) 325 MG tablet Take 650 mg by mouth every 6 (six) hours as needed (pain).   Yes Historical Provider, MD  amoxicillin (AMOXIL) 875 MG tablet Take 875 mg by mouth 2 (two) times daily. 10 day course started 04/22/14 04/22/14  Yes Historical Provider, MD  guaifenesin (ROBITUSSIN) 100 MG/5ML syrup Take 200 mg by mouth 2 (two) times daily as needed for cough or congestion.   Yes Historical Provider, MD  olopatadine (PATANOL) 0.1 % ophthalmic solution Place 1 drop into both eyes daily as needed for allergies.  07/29/13 07/29/14 Yes Historical Provider, MD  ondansetron (ZOFRAN) 4 MG tablet Take 1 tablet (4 mg total) by mouth every 8 (eight) hours as needed for nausea or vomiting. 12/09/13  Yes Fermin Schwabamer Yalcinkaya, MD  Prenatal Vit-Fe Fumarate-FA (PRENATAL MULTIVITAMIN) TABS tablet Take 1 tablet by mouth daily at 12 noon.   Yes Historical Provider, MD  letrozole (FEMARA) 2.5 MG tablet Take 3 tablets (7.5 mg total) by mouth daily. Days 3-7 of menstrual cycle Patient not taking: Reported on 04/24/2014 12/09/13   Fermin Schwabamer Yalcinkaya, MD  oxyCODONE-acetaminophen (PERCOCET) 7.5-325 MG per tablet Take  1 tablet by mouth every 4 (four) hours as needed for pain. Patient not taking: Reported on 04/24/2014 12/09/13   Fermin Schwabamer Yalcinkaya, MD   BP 109/69 mmHg  Pulse 124  Temp(Src) 98.2 F (36.8 C) (Oral)  Resp 18  SpO2 100%  LMP 03/10/2014 Physical Exam  Constitutional: She is oriented to person, place, and time. She appears well-developed and well-nourished.  HENT:  Head: Normocephalic.  Right Ear: External ear normal.   Left Ear: External ear normal.  Nose: Mucosal edema present. Right sinus exhibits frontal sinus tenderness. Left sinus exhibits frontal sinus tenderness.  Mouth/Throat: Oropharynx is clear and moist.  Neck: Normal range of motion. Neck supple.  Cardiovascular: Normal rate and normal heart sounds.   No murmur heard. Pulmonary/Chest: Effort normal and breath sounds normal. She has no wheezes. She has no rales.  Abdominal: Soft. Bowel sounds are normal. She exhibits no distension. There is no guarding.  Abdominal exam is benign.  Musculoskeletal: Normal range of motion.  Lymphadenopathy:    She has no cervical adenopathy.  Neurological: She is alert and oriented to person, place, and time.  Skin: Skin is warm and dry. No pallor.  Psychiatric: She has a normal mood and affect.    ED Course  Procedures (including critical care time) Labs Review Labs Reviewed  URINALYSIS, ROUTINE W REFLEX MICROSCOPIC - Abnormal; Notable for the following:    APPearance CLOUDY (*)    Hgb urine dipstick TRACE (*)    Leukocytes, UA LARGE (*)    All other components within normal limits  URINE MICROSCOPIC-ADD ON - Abnormal; Notable for the following:    Squamous Epithelial / LPF MANY (*)    Bacteria, UA FEW (*)    All other components within normal limits  POC URINE PREG, ED - Abnormal; Notable for the following:    Preg Test, Ur POSITIVE (*)    All other components within normal limits    Imaging Review Dg Chest 2 View  04/24/2014   CLINICAL DATA:  Subacute onset of cough over 4 weeks. Initial encounter.  EXAM: CHEST  2 VIEW  COMPARISON:  None.  FINDINGS: The lungs are well-aerated and clear. There is no evidence of focal opacification, pleural effusion or pneumothorax.  The heart is normal in size; the mediastinal contour is within normal limits. No acute osseous abnormalities are seen.  IMPRESSION: No acute cardiopulmonary process seen.   Electronically Signed   By: Roanna RaiderJeffery  Chang M.D.   On:  04/24/2014 22:16     EKG Interpretation None      MDM   Final diagnoses:  Cough    CXR negative. Tussionex prescribed for cough. OTC medications recommended for supportive measures. Encouraged PCP follow up.  No new abdominal or vaginal symptoms. Pelvic exam deferred. She reports she is here for URI symptoms and help with cough. She has prenatal care pending.    Arnoldo HookerShari A Aaryn Parrilla, PA-C 04/26/14 2259  Purvis SheffieldForrest Harrison, MD 04/27/14 [phone]

## 2014-04-24 NOTE — Discharge Instructions (Signed)
Cough, Adult   A cough is a reflex. It helps you clear your throat and airways. A cough can help heal your body. A cough can last 2 or 3 weeks (acute) or may last more than 8 weeks (chronic). Some common causes of a cough can include an infection, allergy, or a cold.  HOME CARE  · Only take medicine as told by your doctor.  · If given, take your medicines (antibiotics) as told. Finish them even if you start to feel better.  · Use a cold steam vaporizer or humidifier in your home. This can help loosen thick spit (secretions).  · Sleep so you are almost sitting up (semi-upright). Use pillows to do this. This helps reduce coughing.  · Rest as needed.  · Stop smoking if you smoke.  GET HELP RIGHT AWAY IF:  · You have yellowish-white fluid (pus) in your thick spit.  · Your cough gets worse.  · Your medicine does not reduce coughing, and you are losing sleep.  · You cough up blood.  · You have trouble breathing.  · Your pain gets worse and medicine does not help.  · You have a fever.  MAKE SURE YOU:   · Understand these instructions.  · Will watch your condition.  · Will get help right away if you are not doing well or get worse.  Document Released: 01/04/2011 Document Revised: 09/07/2013 Document Reviewed: 01/04/2011  Exit[employer]® Patient Information ©2015 [employer]. This information is not intended to replace advice given to you by your health care provider. Make sure you discuss any questions you have with your health care provider.

## 2014-04-24 NOTE — ED Notes (Signed)
Presents with fever of 101.2, ear pain, neck and throat pain, cough, headache and runny nose, she also c/o lower abdominal pain and she is [redacted] weeks pregnant associated with nausea. Reports yellow vaginal discharge.

## 2014-04-26 ENCOUNTER — Encounter (HOSPITAL_COMMUNITY): Payer: Self-pay | Admitting: *Deleted

## 2014-04-26 ENCOUNTER — Inpatient Hospital Stay (HOSPITAL_COMMUNITY): Payer: BC Managed Care – PPO

## 2014-04-26 ENCOUNTER — Inpatient Hospital Stay (HOSPITAL_COMMUNITY)
Admission: AD | Admit: 2014-04-26 | Discharge: 2014-04-26 | Disposition: A | Discharge status: Home / Self Care | Payer: BC Managed Care – PPO | Source: Ambulatory Visit | Attending: Obstetrics and Gynecology | Admitting: Obstetrics and Gynecology

## 2014-04-26 DIAGNOSIS — R110 Nausea: Secondary | ICD-10-CM | POA: Diagnosis not present

## 2014-04-26 DIAGNOSIS — O2341 Unspecified infection of urinary tract in pregnancy, first trimester: Secondary | ICD-10-CM | POA: Insufficient documentation

## 2014-04-26 DIAGNOSIS — N390 Urinary tract infection, site not specified: Secondary | ICD-10-CM

## 2014-04-26 DIAGNOSIS — R58 Hemorrhage, not elsewhere classified: Secondary | ICD-10-CM

## 2014-04-26 DIAGNOSIS — O468X1 Other antepartum hemorrhage, first trimester: Secondary | ICD-10-CM

## 2014-04-26 DIAGNOSIS — Z3A00 Weeks of gestation of pregnancy not specified: Secondary | ICD-10-CM | POA: Insufficient documentation

## 2014-04-26 DIAGNOSIS — Z3A01 Less than 8 weeks gestation of pregnancy: Secondary | ICD-10-CM | POA: Diagnosis not present

## 2014-04-26 DIAGNOSIS — O99011 Anemia complicating pregnancy, first trimester: Secondary | ICD-10-CM

## 2014-04-26 DIAGNOSIS — N939 Abnormal uterine and vaginal bleeding, unspecified: Secondary | ICD-10-CM | POA: Diagnosis present

## 2014-04-26 DIAGNOSIS — D649 Anemia, unspecified: Secondary | ICD-10-CM | POA: Diagnosis not present

## 2014-04-26 DIAGNOSIS — Z3490 Encounter for supervision of normal pregnancy, unspecified, unspecified trimester: Secondary | ICD-10-CM

## 2014-04-26 DIAGNOSIS — O418X10 Other specified disorders of amniotic fluid and membranes, first trimester, not applicable or unspecified: Secondary | ICD-10-CM

## 2014-04-26 HISTORY — DX: Depression, unspecified: F32.A

## 2014-04-26 HISTORY — DX: Major depressive disorder, single episode, unspecified: F32.9

## 2014-04-26 HISTORY — DX: Headache, unspecified: R51.9

## 2014-04-26 HISTORY — DX: Headache: R51

## 2014-04-26 LAB — ABO/RH: ABO/RH(D): B POS

## 2014-04-26 LAB — URINALYSIS, ROUTINE W REFLEX MICROSCOPIC
Bilirubin Urine: NEGATIVE
Glucose, UA: NEGATIVE mg/dL
Ketones, ur: 15 mg/dL — AB
Nitrite: NEGATIVE
Protein, ur: NEGATIVE mg/dL
UROBILINOGEN UA: 0.2 mg/dL (ref 0.0–1.0)
pH: 6 (ref 5.0–8.0)

## 2014-04-26 LAB — URINE MICROSCOPIC-ADD ON

## 2014-04-26 LAB — CBC
HEMATOCRIT: 32.5 % — AB (ref 36.0–46.0)
Hemoglobin: 11.4 g/dL — ABNORMAL LOW (ref 12.0–15.0)
MCH: 31.8 pg (ref 26.0–34.0)
MCHC: 35.1 g/dL (ref 30.0–36.0)
MCV: 90.8 fL (ref 78.0–100.0)
PLATELETS: 206 10*3/uL (ref 150–400)
RBC: 3.58 MIL/uL — ABNORMAL LOW (ref 3.87–5.11)
RDW: 12.2 % (ref 11.5–15.5)
WBC: 7.3 10*3/uL (ref 4.0–10.5)

## 2014-04-26 LAB — WET PREP, GENITAL
Trich, Wet Prep: NONE SEEN
Yeast Wet Prep HPF POC: NONE SEEN

## 2014-04-26 LAB — OB RESULTS CONSOLE ANTIBODY SCREEN: Antibody Screen: NEGATIVE

## 2014-04-26 LAB — OB RESULTS CONSOLE ABO/RH: RH Type: POSITIVE

## 2014-04-26 LAB — HCG, QUANTITATIVE, PREGNANCY: HCG, BETA CHAIN, QUANT, S: 18321 m[IU]/mL — AB (ref <5)

## 2014-04-26 MED ORDER — PROMETHAZINE HCL 25 MG PO TABS
25.0000 mg | ORAL_TABLET | Freq: Once | ORAL | Status: AC
  Administered 2014-04-26: 25 mg via ORAL
  Filled 2014-04-26: qty 1

## 2014-04-26 MED ORDER — CEPHALEXIN 500 MG PO CAPS: 500.0000 mg | ORAL_CAPSULE | Freq: Four times a day (QID) | ORAL | Status: DC

## 2014-04-26 MED ORDER — OXYCODONE-ACETAMINOPHEN 5-325 MG PO TABS
1.0000 | ORAL_TABLET | Freq: Once | ORAL | Status: AC
  Administered 2014-04-26: 1 via ORAL
  Filled 2014-04-26: qty 1

## 2014-04-26 MED ORDER — PROMETHAZINE HCL 25 MG PO TABS: 25.0000 mg | ORAL_TABLET | Freq: Four times a day (QID) | ORAL | Status: DC | PRN

## 2014-04-26 MED ORDER — OXYCODONE-ACETAMINOPHEN 5-325 MG PO TABS: 2.0000 | ORAL_TABLET | ORAL | Status: DC | PRN

## 2014-04-26 NOTE — MAU Provider Note (Signed)
History     CSN: [id number]  Arrival date and time: 04/26/14 1625   None     No chief complaint on file.  HPI Comments: Audrey Vega 28 y.o. G2P1001  presents to MAU with vaginal bleeding that started this morning and abdominal pains that have been ongoing for one month. She is also nauseated and feels she has a vaginal yeast infection. She complains her urine burn, and hurts and she does not empty her bladder fully.      Past Medical History  Diagnosis Date  . Chronic salpingitis and oophoritis   . Intrauterine synechiae   . History of Hirschsprung's disease     INFANT --  S/P  SURGICAL REPAIR  . Headache   . Depression     Past Surgical History  Procedure Laterality Date  . Colon surgery  INFANT    HIRSCHSPRUNG'S DISEASE  . Cesarean section  2009  . Laparoscopy N/A 12/09/2013    Procedure: LAPAROSCOPY LYSIS OF ADHESIONS, right partial SALPINGECTOMY, left fimbrioplasty, enterolysis;  Surgeon: Fermin Schwab, MD;  Location: [facility];  Service: Gynecology;  Laterality: N/A;    History reviewed. No pertinent family history.  History  Substance Use Topics  . Smoking status: Never Smoker   . Smokeless tobacco: Never Used  . Alcohol Use: Yes     Comment: OCCASIONAL    Allergies:  Allergies  Allergen Reactions  . Other Itching    Vick's vapor rub    Prescriptions prior to admission  Medication Sig Dispense Refill Last Dose  . acetaminophen (TYLENOL) 325 MG tablet Take 650 mg by mouth every 6 (six) hours as needed (pain).   04/23/2014 at Unknown time  . amoxicillin (AMOXIL) 875 MG tablet Take 875 mg by mouth 2 (two) times daily. 10 day course started 04/22/14  0 04/24/2014 at am  . chlorpheniramine-HYDROcodone (TUSSIONEX PENNKINETIC ER) 10-8 MG/5ML LQCR Take 5 mLs by mouth every 12 (twelve) hours as needed for cough. 50 mL 0   . guaifenesin (ROBITUSSIN) 100 MG/5ML syrup Take 200 mg by mouth 2 (two) times daily as needed for cough or  congestion.   04/24/2014 at Unknown time  . letrozole (FEMARA) 2.5 MG tablet Take 3 tablets (7.5 mg total) by mouth daily. Days 3-7 of menstrual cycle (Patient not taking: Reported on 04/24/2014) 15 tablet 2 Not Taking at Unknown time  . olopatadine (PATANOL) 0.1 % ophthalmic solution Place 1 drop into both eyes daily as needed for allergies.    few months ago  . ondansetron (ZOFRAN) 4 MG tablet Take 1 tablet (4 mg total) by mouth every 8 (eight) hours as needed for nausea or vomiting. 15 tablet 0 couple months ago  . oxyCODONE-acetaminophen (PERCOCET) 7.5-325 MG per tablet Take 1 tablet by mouth every 4 (four) hours as needed for pain. (Patient not taking: Reported on 04/24/2014) 30 tablet 0 Not Taking at Unknown time  . predniSONE (DELTASONE) 20 MG tablet Take 3 tablets (60 mg total) by mouth daily. 9 tablet 0   . Prenatal Vit-Fe Fumarate-FA (PRENATAL MULTIVITAMIN) TABS tablet Take 1 tablet by mouth daily at 12 noon.   04/23/2014 at Unknown time    Review of Systems  HENT:       Getting over cold  Respiratory: Positive for cough.   Cardiovascular: Negative.   Gastrointestinal: Positive for nausea and abdominal pain.  Genitourinary:       Vaginal yeast  Musculoskeletal: Negative.   Neurological: Negative.   Psychiatric/Behavioral: Negative.  Physical Exam   Last menstrual period 03/10/2014.  Physical Exam  Constitutional: She is oriented to person, place, and time. She appears well-developed and well-nourished. No distress.  HENT:  Head: Normocephalic and atraumatic.  GI: Soft. She exhibits no distension. There is no tenderness. There is no rebound.  Genitourinary:  Genital: external redness over labia Vaginal: pink discharge with dark red streak of blood coming from os Cervix: closed/ thick Bimanual: nontender   Musculoskeletal: Normal range of motion.  Neurological: She is alert and oriented to person, place, and time.  Skin: Skin is warm and dry.  Psychiatric: She has a  normal mood and affect. Her behavior is normal. Judgment and thought content normal.   Results for orders placed or performed during the hospital encounter of 04/26/14 (from the past 24 hour(s))  Urinalysis, Routine w reflex microscopic     Status: Abnormal   Collection Time: 04/26/14  4:49 PM  Result Value Ref Range   Color, Urine YELLOW YELLOW   APPearance CLEAR CLEAR   Specific Gravity, Urine >1.030 (H) 1.005 - 1.030   pH 6.0 5.0 - 8.0   Glucose, UA NEGATIVE NEGATIVE mg/dL   Hgb urine dipstick LARGE (A) NEGATIVE   Bilirubin Urine NEGATIVE NEGATIVE   Ketones, ur 15 (A) NEGATIVE mg/dL   Protein, ur NEGATIVE NEGATIVE mg/dL   Urobilinogen, UA 0.2 0.0 - 1.0 mg/dL   Nitrite NEGATIVE NEGATIVE   Leukocytes, UA MODERATE (A) NEGATIVE  Urine microscopic-add on     Status: Abnormal   Collection Time: 04/26/14  4:49 PM  Result Value Ref Range   Squamous Epithelial / LPF FEW (A) RARE   WBC, UA 3-6 <3 WBC/hpf   RBC / HPF 11-20 <3 RBC/hpf   Bacteria, UA FEW (A) RARE  CBC     Status: Abnormal   Collection Time: 04/26/14  5:23 PM  Result Value Ref Range   WBC 7.3 4.0 - 10.5 K/uL   RBC 3.58 (L) 3.87 - 5.11 MIL/uL   Hemoglobin 11.4 (L) 12.0 - 15.0 g/dL   HCT 16.132.5 (L) 09.636.0 - 04.546.0 %   MCV 90.8 78.0 - 100.0 fL   MCH 31.8 26.0 - 34.0 pg   MCHC 35.1 30.0 - 36.0 g/dL   RDW 40.912.2 81.111.5 - 91.415.5 %   Platelets 206 150 - 400 K/uL  hCG, quantitative, pregnancy     Status: Abnormal   Collection Time: 04/26/14  5:23 PM  Result Value Ref Range   hCG, Beta Chain, Quant, S 18321 (H) <5 mIU/mL  ABO/Rh     Status: None (Preliminary result)   Collection Time: 04/26/14  5:23 PM  Result Value Ref Range   ABO/RH(D) B POS   Wet prep, genital     Status: Abnormal   Collection Time: 04/26/14  6:55 PM  Result Value Ref Range   Yeast Wet Prep HPF POC NONE SEEN NONE SEEN   Trich, Wet Prep NONE SEEN NONE SEEN   Clue Cells Wet Prep HPF POC FEW (A) NONE SEEN   WBC, Wet Prep HPF POC FEW (A) NONE SEEN  . Dg Chest 2  View  04/24/2014   CLINICAL DATA:  Subacute onset of cough over 4 weeks. Initial encounter.  EXAM: CHEST  2 VIEW  COMPARISON:  None.  FINDINGS: The lungs are well-aerated and clear. There is no evidence of focal opacification, pleural effusion or pneumothorax.  The heart is normal in size; the mediastinal contour is within normal limits. No acute osseous abnormalities are seen.  IMPRESSION: No acute cardiopulmonary process seen.   Electronically Signed   By: Roanna RaiderJeffery  Chang M.D.   On: 04/24/2014 22:16   [place] Ob Comp Less 14 Wks  04/26/2014   CLINICAL DATA:  Vaginal bleeding  EXAM: OBSTETRIC <14 WK US AND TRANSVAGINAL OB US  TECHNIQUE: Both transabdominal and transvaginal ultrasound examinations were performed for complete evaluation of the gestation as well as the maternal uterus, adnexal regions, and pelvic cul-de-sac. Transvaginal technique was performed to assess early pregnancy.  COMPARISON:  None.  FINDINGS: Intrauterine gestational sac: Visualized/normal in shape.  Yolk sac:  Visualized  Embryo:  Visualized  Cardiac Activity: Visualized  Heart Rate:  114 bpm  CRL:   3  mm  6 w 0 d                  US EDC: December 20, 2014  Maternal uterus/adnexae: There is a small subchorionic hemorrhage measuring 1.1 x 0.6 cm. The cervical os is closed. Anteriorly, there is a cesarian section scar.  Maternal ovaries appear normal in size and contour bilaterally. A small corpus luteum measuring approximately 1 cm is noted in the left ovary. No free pelvic fluid.  IMPRESSION: Single live intrauterine gestation with estimated gestational age of [redacted] weeks. Small subchorionic hemorrhage which will warrant close clinical and imaging surveillance. Cesarian section scar anteriorly noted.   Electronically Signed   By: Bretta BangWilliam  Woodruff M.D.   On: 04/26/2014 18:53   [place] Ob Transvaginal  04/26/2014   CLINICAL DATA:  Vaginal bleeding  EXAM: OBSTETRIC <14 WK US AND TRANSVAGINAL OB US  TECHNIQUE: Both transabdominal and transvaginal  ultrasound examinations were performed for complete evaluation of the gestation as well as the maternal uterus, adnexal regions, and pelvic cul-de-sac. Transvaginal technique was performed to assess early pregnancy.  COMPARISON:  None.  FINDINGS: Intrauterine gestational sac: Visualized/normal in shape.  Yolk sac:  Visualized  Embryo:  Visualized  Cardiac Activity: Visualized  Heart Rate:  114 bpm  CRL:   3  mm  6 w 0 d                  US EDC: December 20, 2014  Maternal uterus/adnexae: There is a small subchorionic hemorrhage measuring 1.1 x 0.6 cm. The cervical os is closed. Anteriorly, there is a cesarian section scar.  Maternal ovaries appear normal in size and contour bilaterally. A small corpus luteum measuring approximately 1 cm is noted in the left ovary. No free pelvic fluid.  IMPRESSION: Single live intrauterine gestation with estimated gestational age of [redacted] weeks. Small subchorionic hemorrhage which will warrant close clinical and imaging surveillance. Cesarian section scar anteriorly noted.   Electronically Signed   By: Bretta BangWilliam  Woodruff M.D.   On: 04/26/2014 18:53    MAU Course  Procedures  MDM  Wet prep, GC, Chlamydia, CBC, UA, U/S, ABORh, Quant, HIV, urine culture Called Dr Claiborne Billingsallahan with report  Assessment and Plan   A: Pregnancy with nausea Small Subchorionic hemorrhage UTI Anemia  P: Miscarriage precautions Percocet/ phenergan Keflex 500 mg po QID x 7days Increase fluids/ iron rich foods Pelvic rest Follow up with Dr Denice BorsAnderson/ [place] Return to MAU as needed    Carolynn ServeBarefoot, Laurann Mcmorris Miller 04/26/2014, 7:48 PM

## 2014-04-26 NOTE — MAU Note (Signed)
Urine in lab 

## 2014-04-26 NOTE — Discharge Instructions (Signed)
Urinary Tract Infection Urinary tract infections (UTIs) can develop anywhere along your urinary tract. Your urinary tract is your body's drainage system for removing wastes and extra water. Your urinary tract includes two kidneys, two ureters, a bladder, and a urethra. Your kidneys are a pair of bean-shaped organs. Each kidney is about the size of your fist. They are located below your ribs, one on each side of your spine. CAUSES Infections are caused by microbes, which are microscopic organisms, including fungi, viruses, and bacteria. These organisms are so small that they can only be seen through a microscope. Bacteria are the microbes that most commonly cause UTIs. SYMPTOMS  Symptoms of UTIs may vary by age and gender of the patient and by the location of the infection. Symptoms in young women typically include a frequent and intense urge to urinate and a painful, burning feeling in the bladder or urethra during urination. Older women and men are more likely to be tired, shaky, and weak and have muscle aches and abdominal pain. A fever may mean the infection is in your kidneys. Other symptoms of a kidney infection include pain in your back or sides below the ribs, nausea, and vomiting. DIAGNOSIS To diagnose a UTI, your caregiver will ask you about your symptoms. Your caregiver also will ask to provide a urine sample. The urine sample will be tested for bacteria and white blood cells. White blood cells are made by your body to help fight infection. TREATMENT  Typically, UTIs can be treated with medication. Because most UTIs are caused by a bacterial infection, they usually can be treated with the use of antibiotics. The choice of antibiotic and length of treatment depend on your symptoms and the type of bacteria causing your infection. HOME CARE INSTRUCTIONS  If you were prescribed antibiotics, take them exactly as your caregiver instructs you. Finish the medication even if you feel better after you  have only taken some of the medication.  Drink enough water and fluids to keep your urine clear or pale yellow.  Avoid caffeine, tea, and carbonated beverages. They tend to irritate your bladder.  Empty your bladder often. Avoid holding urine for long periods of time.  Empty your bladder before and after sexual intercourse.  After a bowel movement, women should cleanse from front to back. Use each tissue only once. SEEK MEDICAL CARE IF:   You have back pain.  You develop a fever.  Your symptoms do not begin to resolve within 3 days. SEEK IMMEDIATE MEDICAL CARE IF:   You have severe back pain or lower abdominal pain.  You develop chills.  You have nausea or vomiting.  You have continued burning or discomfort with urination. MAKE SURE YOU:   Understand these instructions.  Will watch your condition.  Will get help right away if you are not doing well or get worse. Document Released: 01/31/2005 Document Revised: 10/23/2011 Document Reviewed: 06/01/2011 [facility] Patient Information 2015 [place], [place]. This information is not intended to replace advice given to you by your health care provider. Make sure you discuss any questions you have with your health care provider. Anemia, Nonspecific Anemia is a condition in which the concentration of red blood cells or hemoglobin in the blood is below normal. Hemoglobin is a substance in red blood cells that carries oxygen to the tissues of the body. Anemia results in not enough oxygen reaching these tissues.  CAUSES  Common causes of anemia include:   Excessive bleeding. Bleeding may be internal or external. This  includes excessive bleeding from periods (in women) or from the intestine.   Poor nutrition.   Chronic kidney, thyroid, and liver disease.  Bone marrow disorders that decrease red blood cell production.  Cancer and treatments for cancer.  HIV, AIDS, and their treatments.  Spleen problems that increase red blood  cell destruction.  Blood disorders.  Excess destruction of red blood cells due to infection, medicines, and autoimmune disorders. SIGNS AND SYMPTOMS   Minor weakness.   Dizziness.   Headache.  Palpitations.   Shortness of breath, especially with exercise.   Paleness.  Cold sensitivity.  Indigestion.  Nausea.  Difficulty sleeping.  Difficulty concentrating. Symptoms may occur suddenly or they may develop slowly.  DIAGNOSIS  Additional blood tests are often needed. These help your health care provider determine the best treatment. Your health care provider will check your stool for blood and look for other causes of blood loss.  TREATMENT  Treatment varies depending on the cause of the anemia. Treatment can include:   Supplements of iron, vitamin B12, or folic acid.   Hormone medicines.   A blood transfusion. This may be needed if blood loss is severe.   Hospitalization. This may be needed if there is significant continual blood loss.   Dietary changes.  Spleen removal. HOME CARE INSTRUCTIONS Keep all follow-up appointments. It often takes many weeks to correct anemia, and having your health care provider check on your condition and your response to treatment is very important. SEEK IMMEDIATE MEDICAL CARE IF:   You develop extreme weakness, shortness of breath, or chest pain.   You become dizzy or have trouble concentrating.  You develop heavy vaginal bleeding.   You develop a rash.   You have bloody or black, tarry stools.   You faint.   You vomit up blood.   You vomit repeatedly.   You have abdominal pain.  You have a fever or persistent symptoms for more than 2-3 days.   You have a fever and your symptoms suddenly get worse.   You are dehydrated.  MAKE SURE YOU:  Understand these instructions.  Will watch your condition.  Will get help right away if you are not doing well or get worse. Document Released: 05/31/2004  Document Revised: 12/24/2012 Document Reviewed: 10/17/2012 [facility] Patient Information 2015 [place], [place]. This information is not intended to replace advice given to you by your health care provider. Make sure you discuss any questions you have with your health care provider. Threatened Miscarriage A threatened miscarriage occurs when you have vaginal bleeding during your first 20 weeks of pregnancy but the pregnancy has not ended. If you have vaginal bleeding during this time, your health care provider will do tests to make sure you are still pregnant. If the tests show you are still pregnant and the developing baby (fetus) inside your womb (uterus) is still growing, your condition is considered a threatened miscarriage. A threatened miscarriage does not mean your pregnancy will end, but it does increase the risk of losing your pregnancy (complete miscarriage). CAUSES  The cause of a threatened miscarriage is usually not known. If you go on to have a complete miscarriage, the most common cause is an abnormal number of chromosomes in the developing baby. Chromosomes are the structures inside cells that hold all your genetic material. Some causes of vaginal bleeding that do not result in miscarriage include:  Having sex.  Having an infection.  Normal hormone changes of pregnancy.  Bleeding that occurs when an egg implants  in your uterus. RISK FACTORS Risk factors for bleeding in early pregnancy include:  Obesity.  Smoking.  Drinking excessive amounts of alcohol or caffeine.  Recreational drug use. SIGNS AND SYMPTOMS  Light vaginal bleeding.  Mild abdominal pain or cramps. DIAGNOSIS  If you have bleeding with or without abdominal pain before 20 weeks of pregnancy, your health care provider will do tests to check whether you are still pregnant. One important test involves using sound waves and a computer (ultrasound) to create images of the inside of your uterus. Other tests include  an internal exam of your vagina and uterus (pelvic exam) and measurement of your baby's heart rate.  You may be diagnosed with a threatened miscarriage if:  Ultrasound testing shows you are still pregnant.  Your baby's heart rate is strong.  A pelvic exam shows that the opening between your uterus and your vagina (cervix) is closed.  Your heart rate and blood pressure are stable.  Blood tests confirm you are still pregnant. TREATMENT  No treatments have been shown to prevent a threatened miscarriage from going on to a complete miscarriage. However, the right home care is important.  HOME CARE INSTRUCTIONS   Make sure you keep all your appointments for prenatal care. This is very important.  Get plenty of rest.  Do not have sex or use tampons if you have vaginal bleeding.  Do not douche.  Do not smoke or use recreational drugs.  Do not drink alcohol.  Avoid caffeine. SEEK MEDICAL CARE IF:  You have light vaginal bleeding or spotting while pregnant.  You have abdominal pain or cramping.  You have a fever. SEEK IMMEDIATE MEDICAL CARE IF:  You have heavy vaginal bleeding.  You have blood clots coming from your vagina.  You have severe low back pain or abdominal cramps.  You have fever, chills, and severe abdominal pain. MAKE SURE YOU:  Understand these instructions.  Will watch your condition.  Will get help right away if you are not doing well or get worse. Document Released: 04/23/2005 Document Revised: 04/28/2013 Document Reviewed: 02/17/2013 [facility] Patient Information 2015 [place], [place]. This information is not intended to replace advice given to you by your health care provider. Make sure you discuss any questions you have with your health care provider.

## 2014-04-26 NOTE — MAU Note (Signed)
[facility].  Now having vag burning and itching since yesterday, vag bleeding started this morning and lower abdominal pain worsening over the past few days. Has her first OB appt with Dr. Dareen PianoAnderson Jan 5th 2016.

## 2014-04-28 LAB — CULTURE, OB URINE
Colony Count: 10000
Special Requests: NORMAL

## 2014-05-05 LAB — HIV ANTIBODY (ROUTINE TESTING W REFLEX)

## 2014-05-07 NOTE — L&D Delivery Note (Signed)
Patient was C/C/+2 and pushed for 40 minutes with epidural.   NSVD  female infant, Apgars 8,9, weight P.   The patient had a midline second degree perineal laceration repaired with 2-0 vicryl R. Fundus was firm. EBL was expected amount. Placenta was delivered intact. Vagina was clear.  Baby was vigorous and doing skin to skin with mother.  Mayjor Ager A

## 2014-05-11 ENCOUNTER — Other Ambulatory Visit: Payer: Self-pay | Admitting: Obstetrics and Gynecology

## 2014-05-11 LAB — OB RESULTS CONSOLE GC/CHLAMYDIA
CHLAMYDIA, DNA PROBE: NEGATIVE
Gonorrhea: NEGATIVE

## 2014-05-12 LAB — GC/CHLAMYDIA PROBE AMP
CT Probe RNA: NEGATIVE
GC Probe RNA: NEGATIVE

## 2014-05-12 LAB — HIV ANTIBODY: HIV: NONREACTIVE

## 2014-06-07 LAB — OB RESULTS CONSOLE RPR: RPR: NONREACTIVE

## 2014-06-07 LAB — OB RESULTS CONSOLE HIV ANTIBODY (ROUTINE TESTING): HIV: NONREACTIVE

## 2014-06-07 LAB — OB RESULTS CONSOLE HEPATITIS B SURFACE ANTIGEN: HEP B S AG: NEGATIVE

## 2014-06-07 LAB — OB RESULTS CONSOLE RUBELLA ANTIBODY, IGM: Rubella: IMMUNE

## 2014-06-29 ENCOUNTER — Inpatient Hospital Stay (HOSPITAL_COMMUNITY)
Admission: AD | Admit: 2014-06-29 | Discharge: 2014-06-29 | Disposition: A | Discharge status: Home / Self Care | Payer: Medicaid Other | Source: Ambulatory Visit | Attending: Obstetrics and Gynecology | Admitting: Obstetrics and Gynecology

## 2014-06-29 DIAGNOSIS — O9989 Other specified diseases and conditions complicating pregnancy, childbirth and the puerperium: Secondary | ICD-10-CM | POA: Diagnosis not present

## 2014-06-29 DIAGNOSIS — Z3A15 15 weeks gestation of pregnancy: Secondary | ICD-10-CM | POA: Diagnosis not present

## 2014-06-29 DIAGNOSIS — A084 Viral intestinal infection, unspecified: Secondary | ICD-10-CM | POA: Insufficient documentation

## 2014-06-29 DIAGNOSIS — O210 Mild hyperemesis gravidarum: Secondary | ICD-10-CM | POA: Diagnosis present

## 2014-06-29 DIAGNOSIS — Z3A16 16 weeks gestation of pregnancy: Secondary | ICD-10-CM

## 2014-06-29 LAB — URINALYSIS, ROUTINE W REFLEX MICROSCOPIC
BILIRUBIN URINE: NEGATIVE
Glucose, UA: 500 mg/dL — AB
Hgb urine dipstick: NEGATIVE
Ketones, ur: >80 mg/dL — AB
Leukocytes, UA: NEGATIVE
NITRITE: NEGATIVE
PH: 6 (ref 5.0–8.0)
Protein, ur: NEGATIVE mg/dL
SPECIFIC GRAVITY, URINE: 1.025 (ref 1.005–1.030)
Urobilinogen, UA: 0.2 mg/dL (ref 0.0–1.0)

## 2014-06-29 LAB — CBC
HCT: 35.1 % — ABNORMAL LOW (ref 36.0–46.0)
Hemoglobin: 12.3 g/dL (ref 12.0–15.0)
MCH: 31.9 pg (ref 26.0–34.0)
MCHC: 35 g/dL (ref 30.0–36.0)
MCV: 91.2 fL (ref 78.0–100.0)
Platelets: 176 10*3/uL (ref 150–400)
RBC: 3.85 MIL/uL — AB (ref 3.87–5.11)
RDW: 12.8 % (ref 11.5–15.5)
WBC: 8.9 10*3/uL (ref 4.0–10.5)

## 2014-06-29 LAB — COMPREHENSIVE METABOLIC PANEL
ALT: 17 U/L (ref 0–35)
ANION GAP: 10 (ref 5–15)
AST: 22 U/L (ref 0–37)
Albumin: 3.5 g/dL (ref 3.5–5.2)
Alkaline Phosphatase: 39 U/L (ref 39–117)
BUN: 9 mg/dL (ref 6–23)
CALCIUM: 8.6 mg/dL (ref 8.4–10.5)
CO2: 16 mmol/L — ABNORMAL LOW (ref 19–32)
CREATININE: 0.55 mg/dL (ref 0.50–1.10)
Chloride: 108 mmol/L (ref 96–112)
GLUCOSE: 85 mg/dL (ref 70–99)
Potassium: 3.4 mmol/L — ABNORMAL LOW (ref 3.5–5.1)
Sodium: 134 mmol/L — ABNORMAL LOW (ref 135–145)
TOTAL PROTEIN: 7.1 g/dL (ref 6.0–8.3)
Total Bilirubin: 0.7 mg/dL (ref 0.3–1.2)

## 2014-06-29 MED ORDER — PROMETHAZINE HCL 25 MG/ML IJ SOLN
25.0000 mg | Freq: Once | INTRAMUSCULAR | Status: AC
  Administered 2014-06-29: 25 mg via INTRAVENOUS
  Filled 2014-06-29: qty 1

## 2014-06-29 MED ORDER — LACTATED RINGERS IV BOLUS (SEPSIS)
1000.0000 mL | Freq: Once | INTRAVENOUS | Status: AC
  Administered 2014-06-29: 1000 mL via INTRAVENOUS

## 2014-06-29 MED ORDER — ACETAMINOPHEN 500 MG PO TABS
1000.0000 mg | ORAL_TABLET | Freq: Once | ORAL | Status: AC
  Administered 2014-06-29: 1000 mg via ORAL
  Filled 2014-06-29: qty 2

## 2014-06-29 MED ORDER — FAMOTIDINE IN NACL 20-0.9 MG/50ML-% IV SOLN
20.0000 mg | Freq: Once | INTRAVENOUS | Status: AC
  Administered 2014-06-29: 20 mg via INTRAVENOUS
  Filled 2014-06-29: qty 50

## 2014-06-29 MED ORDER — LACTATED RINGERS IV SOLN
Freq: Once | INTRAVENOUS | Status: AC
  Administered 2014-06-29: 18:00:00 via INTRAVENOUS
  Filled 2014-06-29: qty 1000

## 2014-06-29 MED ORDER — DIPHENHYDRAMINE HCL 50 MG/ML IJ SOLN
25.0000 mg | Freq: Once | INTRAMUSCULAR | Status: AC
  Administered 2014-06-29: 25 mg via INTRAVENOUS
  Filled 2014-06-29: qty 1

## 2014-06-29 MED ORDER — ONDANSETRON HCL 4 MG PO TABS: 4.0000 mg | ORAL_TABLET | Freq: Four times a day (QID) | ORAL | Status: DC

## 2014-06-29 MED ORDER — DEXTROSE 5 % IN LACTATED RINGERS IV BOLUS
1000.0000 mL | Freq: Once | INTRAVENOUS | Status: AC
  Administered 2014-06-29: 1000 mL via INTRAVENOUS

## 2014-06-29 NOTE — MAU Provider Note (Signed)
History     CSN: [id number]  Arrival date and time: 06/29/14 1519   None      No chief complaint on file.  HPIpt is 5462w6d pregnant G2P1001 who presents today with N/V/diarrhea since last night. Pt has felt weak and passes out 2 times at her house today. Pt is not feeling short of breath at this time  RN note: Nurse Signed  MAU Note 06/29/2014 3:53 PM    Expand All Collapse All   Since last night has had diarrhea and Vomiting. Past out at her house twice. Feels very weak. Feels short of breath at times.       Past Medical History  Diagnosis Date  . Chronic salpingitis and oophoritis   . Intrauterine synechiae   . History of Hirschsprung's disease     INFANT --  S/P  SURGICAL REPAIR  . Headache   . Depression     Past Surgical History  Procedure Laterality Date  . Colon surgery  INFANT    HIRSCHSPRUNG'S DISEASE  . Cesarean section  2009  . Laparoscopy N/A 12/09/2013    Procedure: LAPAROSCOPY LYSIS OF ADHESIONS, right partial SALPINGECTOMY, left fimbrioplasty, enterolysis;  Surgeon: Fermin Schwabamer Yalcinkaya, MD;  [facility];  Service: Gynecology;  Laterality: N/A;    No family history on file.  History  Substance Use Topics  . Smoking status: Never Smoker   . Smokeless tobacco: Never Used  . Alcohol Use: Yes     Comment: OCCASIONAL    Allergies:  Allergies  Allergen Reactions  . Other Itching    Vick's vapor rub  . Promethazine Other (See Comments)    Tardive dyskinesia    No prescriptions prior to admission    ROS Physical Exam   Blood pressure 100/50, pulse 92, temperature 98.6 F (37 C), temperature source Oral, resp. rate 18, weight 181 lb (82.101 kg), last menstrual period 03/10/2014, SpO2 100 %.  Physical Exam  Nursing note and vitals reviewed. Constitutional: She is oriented to person, place, and time. She appears well-developed and well-nourished. No distress.  HENT:  Head: Normocephalic.  Eyes: Pupils are equal,  round, and reactive to light.  Neck: Normal range of motion. Neck supple.  Cardiovascular: Normal rate.   Respiratory: Effort normal.  GI: Soft.  FHR 159 by doppler  Musculoskeletal: Normal range of motion.  Neurological: She is alert and oriented to person, place, and time.  Skin: Skin is warm and dry. There is pallor.  Psychiatric: She has a normal mood and affect.    MAU Course  Procedures  Results for orders placed or performed during the hospital encounter of 06/29/14 (from the past 24 hour(s))  CBC     Status: Abnormal   Collection Time: 06/29/14  4:52 PM  Result Value Ref Range   WBC 8.9 4.0 - 10.5 K/uL   RBC 3.85 (L) 3.87 - 5.11 MIL/uL   Hemoglobin 12.3 12.0 - 15.0 g/dL   HCT 40.935.1 (L) 81.136.0 - 91.446.0 %   MCV 91.2 78.0 - 100.0 fL   MCH 31.9 26.0 - 34.0 pg   MCHC 35.0 30.0 - 36.0 g/dL   RDW 78.212.8 95.611.5 - 21.315.5 %   Platelets 176 150 - 400 K/uL  Comprehensive metabolic panel     Status: Abnormal   Collection Time: 06/29/14  4:52 PM  Result Value Ref Range   Sodium 134 (L) 135 - 145 mmol/L   Potassium 3.4 (L) 3.5 - 5.1 mmol/L   Chloride 108 96 -  112 mmol/L   CO2 16 (L) 19 - 32 mmol/L   Glucose, Bld 85 70 - 99 mg/dL   BUN 9 6 - 23 mg/dL   Creatinine, Ser 1.61 0.50 - 1.10 mg/dL   Calcium 8.6 8.4 - 09.6 mg/dL   Total Protein 7.1 6.0 - 8.3 g/dL   Albumin 3.5 3.5 - 5.2 g/dL   AST 22 0 - 37 U/L   ALT 17 0 - 35 U/L   Alkaline Phosphatase 39 39 - 117 U/L   Total Bilirubin 0.7 0.3 - 1.2 mg/dL   GFR calc non Af Amer >90 >90 mL/min   GFR calc Af Amer >90 >90 mL/min   Anion gap 10 5 - 15  Urinalysis, Routine w reflex microscopic     Status: Abnormal   Collection Time: 06/29/14  5:57 PM  Result Value Ref Range   Color, Urine YELLOW YELLOW   APPearance CLEAR CLEAR   Specific Gravity, Urine 1.025 1.005 - 1.030   pH 6.0 5.0 - 8.0   Glucose, UA 500 (A) NEGATIVE mg/dL   Hgb urine dipstick NEGATIVE NEGATIVE   Bilirubin Urine NEGATIVE NEGATIVE   Ketones, ur >80 (A) NEGATIVE mg/dL    Protein, ur NEGATIVE NEGATIVE mg/dL   Urobilinogen, UA 0.2 0.0 - 1.0 mg/dL   Nitrite NEGATIVE NEGATIVE   Leukocytes, UA NEGATIVE NEGATIVE   Pt very difficult stick due to hx of multiple venipunctures as child IV #1 D5LR with Pepcid  IV given IV #2 LR with MVT- pt feeling much better and able to tolerate PO fluids- able to walk without feeling dizzy Pt desires one more liter of fluids- will give LR Discussed with Dr. Henderson Cloud- will give phenergan and Rx to go home with Pt c/o of h/a- will give Tylenol Pt started having tardiv dyskenesia after IV phenergan- reported to pharmacy Benadryl IV ordered withLR- pt's reaction  Pt tolerating graham crackers and PO fluids Care turned over to Vonzella Nipple, PA  Pamelia Hoit 06/29/2014, 4:25 PM   Patient reports significant improvement with Benadryl.  Will discharge home with Rx for Zofran. Dr. Henderson Cloud agrees with plan.   Assessment and Plan  A: SIUP at [redacted]w[redacted]d Acute viral gastroenteritis  P: Discharge home Rx for Zofran given to patient BRAT diet included on AVS Patient advised to follow-up with [facility] as schedule for routine prenatal care or sooner if symptoms worsen Patient may return to MAU as needed or if her condition were to change or worsen   Marny Lowenstein, PA-C 06/29/2014 9:49 PM

## 2014-06-29 NOTE — MAU Note (Signed)
Since last night has had diarrhea and  Vomiting. Past out at her house twice.  Feels very weak.  Feels short of breath at times.

## 2014-06-29 NOTE — Discharge Instructions (Signed)

## 2014-09-15 ENCOUNTER — Other Ambulatory Visit: Payer: Self-pay | Admitting: Obstetrics and Gynecology

## 2014-09-15 LAB — OB RESULTS CONSOLE GC/CHLAMYDIA
Chlamydia: NEGATIVE
Gonorrhea: NEGATIVE

## 2014-11-19 ENCOUNTER — Other Ambulatory Visit: Payer: Self-pay | Admitting: Obstetrics and Gynecology

## 2014-11-19 LAB — OB RESULTS CONSOLE GBS: GBS: POSITIVE

## 2014-12-02 ENCOUNTER — Inpatient Hospital Stay (HOSPITAL_COMMUNITY)
Admission: AD | Admit: 2014-12-02 | Discharge: 2014-12-02 | Disposition: A | Discharge status: Home / Self Care | Payer: Medicaid Other | Source: Ambulatory Visit | Attending: Obstetrics and Gynecology | Admitting: Obstetrics and Gynecology

## 2014-12-02 ENCOUNTER — Encounter (HOSPITAL_COMMUNITY): Payer: Self-pay | Admitting: *Deleted

## 2014-12-02 DIAGNOSIS — Z36 Encounter for antenatal screening of mother: Secondary | ICD-10-CM | POA: Diagnosis not present

## 2014-12-02 DIAGNOSIS — Z3A37 37 weeks gestation of pregnancy: Secondary | ICD-10-CM | POA: Insufficient documentation

## 2014-12-02 DIAGNOSIS — Z3689 Encounter for other specified antenatal screening: Secondary | ICD-10-CM

## 2014-12-02 DIAGNOSIS — O368130 Decreased fetal movements, third trimester, not applicable or unspecified: Secondary | ICD-10-CM | POA: Diagnosis not present

## 2014-12-02 NOTE — MAU Provider Note (Signed)
  History     CSN: [id number]  Arrival date and time: 12/02/14 1714   None     Chief Complaint  Patient presents with  . Decreased Fetal Movement   HPI  Ms. Audrey Vega is a 29 y.o. G2P1001 at [redacted]w[redacted]d here with report of decreased fetal movement that started yesterday.  Fetal movement felt since arrival at MAU.  Also reports irregular contractions.  Cervix checked in office today - 1 cm.  Denies vaginal bleeding or leaking of fluid.    Past Medical History  Diagnosis Date  . Chronic salpingitis and oophoritis   . Intrauterine synechiae   . History of Hirschsprung's disease     INFANT --  S/P  SURGICAL REPAIR  . Headache   . Depression     Past Surgical History  Procedure Laterality Date  . Colon surgery  INFANT    HIRSCHSPRUNG'S DISEASE  . Cesarean section  2009  . Laparoscopy N/A 12/09/2013    Procedure: LAPAROSCOPY LYSIS OF ADHESIONS, right partial SALPINGECTOMY, left fimbrioplasty, enterolysis;  Surgeon: Fermin Schwab, MD;  Location: [facility];  Service: Gynecology;  Laterality: N/A;    No family history on file.  History  Substance Use Topics  . Smoking status: Never Smoker   . Smokeless tobacco: Never Used  . Alcohol Use: Yes     Comment: OCCASIONAL    Allergies:  Allergies  Allergen Reactions  . Other Itching    Vick's vapor rub  . Promethazine Other (See Comments)    Tardive dyskinesia    Prescriptions prior to admission  Medication Sig Dispense Refill Last Dose  . acetaminophen (TYLENOL) 325 MG tablet Take 650 mg by mouth every 6 (six) hours as needed (pain).   Past Week at Unknown time  . hydrocortisone cream 1 % Apply 1 application topically daily as needed for itching.   12/01/2014 at Unknown time  . Prenatal Vit-Fe Fumarate-FA (PRENATAL MULTIVITAMIN) TABS tablet Take 1 tablet by mouth daily at 12 noon.   12/02/2014 at Unknown time  . ondansetron (ZOFRAN) 4 MG tablet Take 1 tablet (4 mg total) by mouth every 6 (six) hours.  (Patient not taking: Reported on 12/02/2014) 12 tablet 0 Not Taking at Unknown time    Review of Systems  Gastrointestinal: Positive for abdominal pain (contractions).       Decreased fetal movement.  Genitourinary: Negative for dysuria, urgency and frequency.  All other systems reviewed and are negative.  Physical Exam   Blood pressure 107/67, pulse 98, temperature 97.9 F (36.6 C), temperature source Oral, resp. rate 18, last menstrual period 03/10/2014.  Physical Exam  Constitutional: She is oriented to person, place, and time. She appears well-developed and well-nourished.  HENT:  Head: Normocephalic.  Neck: Normal range of motion. Neck supple.  Cardiovascular: Normal rate, regular rhythm and normal heart sounds.   Respiratory: Effort normal and breath sounds normal.  Genitourinary: No bleeding in the vagina.  Neurological: She is alert and oriented to person, place, and time.  Skin: Skin is warm and dry.   FHR 130's, +accels Toco - irregular  1840 Consulted with Dr. Tenny Craw > Reviewed HPI/Exam > discharge to home with follow-up in office  MAU Course  Procedures   Assessment and Plan  29 y.o. G2P1001 at [redacted]w[redacted]d IUP  Reactive NST  Plan: Discharge to home Reviewed kick counts Follow-up in office  Elenora Fender [facility] 12/02/2014, 6:43 PM

## 2014-12-02 NOTE — MAU Note (Signed)
Was at office visit, decreased fetal movement. irreg cramps

## 2014-12-17 ENCOUNTER — Inpatient Hospital Stay (HOSPITAL_COMMUNITY): Payer: Medicaid Other | Admitting: Anesthesiology

## 2014-12-17 ENCOUNTER — Encounter (HOSPITAL_COMMUNITY): Payer: Self-pay

## 2014-12-17 ENCOUNTER — Inpatient Hospital Stay (HOSPITAL_COMMUNITY)
Admission: AD | Admit: 2014-12-17 | Discharge: 2014-12-19 | DRG: 775 | Disposition: A | Discharge status: Home / Self Care | Payer: Medicaid Other | Source: Ambulatory Visit | Attending: Obstetrics and Gynecology | Admitting: Obstetrics and Gynecology

## 2014-12-17 DIAGNOSIS — K219 Gastro-esophageal reflux disease without esophagitis: Secondary | ICD-10-CM | POA: Diagnosis present

## 2014-12-17 DIAGNOSIS — E669 Obesity, unspecified: Secondary | ICD-10-CM | POA: Diagnosis present

## 2014-12-17 DIAGNOSIS — Z6835 Body mass index (BMI) 35.0-35.9, adult: Secondary | ICD-10-CM

## 2014-12-17 DIAGNOSIS — O99214 Obesity complicating childbirth: Secondary | ICD-10-CM | POA: Diagnosis present

## 2014-12-17 DIAGNOSIS — Z3A39 39 weeks gestation of pregnancy: Secondary | ICD-10-CM | POA: Diagnosis present

## 2014-12-17 DIAGNOSIS — O9962 Diseases of the digestive system complicating childbirth: Secondary | ICD-10-CM | POA: Diagnosis present

## 2014-12-17 DIAGNOSIS — O99824 Streptococcus B carrier state complicating childbirth: Secondary | ICD-10-CM | POA: Diagnosis present

## 2014-12-17 LAB — CBC
HCT: 36 % (ref 36.0–46.0)
HEMOGLOBIN: 12.4 g/dL (ref 12.0–15.0)
MCH: 31.8 pg (ref 26.0–34.0)
MCHC: 34.4 g/dL (ref 30.0–36.0)
MCV: 92.3 fL (ref 78.0–100.0)
Platelets: 155 10*3/uL (ref 150–400)
RBC: 3.9 MIL/uL (ref 3.87–5.11)
RDW: 14 % (ref 11.5–15.5)
WBC: 9.4 10*3/uL (ref 4.0–10.5)

## 2014-12-17 LAB — PREPARE RBC (CROSSMATCH)

## 2014-12-17 LAB — RPR: RPR Ser Ql: NONREACTIVE

## 2014-12-17 MED ORDER — OXYCODONE-ACETAMINOPHEN 5-325 MG PO TABS: 1.0000 | ORAL_TABLET | ORAL | Status: DC | PRN

## 2014-12-17 MED ORDER — CITRIC ACID-SODIUM CITRATE 334-500 MG/5ML PO SOLN: 30.0000 mL | ORAL | Status: DC | PRN

## 2014-12-17 MED ORDER — FENTANYL 2.5 MCG/ML BUPIVACAINE 1/10 % EPIDURAL INFUSION (WH - ANES)
14.0000 mL/h | INTRAMUSCULAR | Status: DC | PRN
  Administered 2014-12-17 (×2): 14 mL/h via EPIDURAL
  Filled 2014-12-17: qty 125

## 2014-12-17 MED ORDER — ZOLPIDEM TARTRATE 5 MG PO TABS: 5.0000 mg | ORAL_TABLET | Freq: Every evening | ORAL | Status: DC | PRN

## 2014-12-17 MED ORDER — SODIUM CHLORIDE 0.9 % IV SOLN: Freq: Once | INTRAVENOUS | Status: DC

## 2014-12-17 MED ORDER — BENZOCAINE-MENTHOL 20-0.5 % EX AERO: 1.0000 "application " | INHALATION_SPRAY | CUTANEOUS | Status: DC | PRN

## 2014-12-17 MED ORDER — DIPHENHYDRAMINE HCL 50 MG/ML IJ SOLN: 12.5000 mg | INTRAMUSCULAR | Status: DC | PRN

## 2014-12-17 MED ORDER — OXYTOCIN BOLUS FROM INFUSION
500.0000 mL | INTRAVENOUS | Status: DC
  Administered 2014-12-17: 500 mL via INTRAVENOUS

## 2014-12-17 MED ORDER — PHENYLEPHRINE 40 MCG/ML (10ML) SYRINGE FOR IV PUSH (FOR BLOOD PRESSURE SUPPORT)
80.0000 ug | PREFILLED_SYRINGE | INTRAVENOUS | Status: DC | PRN
  Administered 2014-12-17 (×2): 80 ug via INTRAVENOUS
  Filled 2014-12-17: qty 2

## 2014-12-17 MED ORDER — EPHEDRINE 5 MG/ML INJ
10.0000 mg | Freq: Once | INTRAVENOUS | Status: AC
  Administered 2014-12-17: 10 mg via INTRAVENOUS

## 2014-12-17 MED ORDER — ACETAMINOPHEN 325 MG PO TABS: 650.0000 mg | ORAL_TABLET | ORAL | Status: DC | PRN

## 2014-12-17 MED ORDER — IBUPROFEN 800 MG PO TABS
800.0000 mg | ORAL_TABLET | Freq: Three times a day (TID) | ORAL | Status: DC
  Administered 2014-12-17 – 2014-12-19 (×4): 800 mg via ORAL
  Filled 2014-12-17 (×5): qty 1

## 2014-12-17 MED ORDER — SENNOSIDES-DOCUSATE SODIUM 8.6-50 MG PO TABS
2.0000 | ORAL_TABLET | ORAL | Status: DC
  Administered 2014-12-17 – 2014-12-18 (×2): 2 via ORAL
  Filled 2014-12-17 (×2): qty 2

## 2014-12-17 MED ORDER — SODIUM CHLORIDE 0.9 % IV SOLN
2.0000 g | Freq: Four times a day (QID) | INTRAVENOUS | Status: DC
  Filled 2014-12-17 (×3): qty 2000

## 2014-12-17 MED ORDER — SODIUM CHLORIDE 0.9 % IJ SOLN: 3.0000 mL | Freq: Two times a day (BID) | INTRAMUSCULAR | Status: DC

## 2014-12-17 MED ORDER — WITCH HAZEL-GLYCERIN EX PADS: 1.0000 "application " | MEDICATED_PAD | CUTANEOUS | Status: DC | PRN

## 2014-12-17 MED ORDER — LIDOCAINE HCL (PF) 1 % IJ SOLN
30.0000 mL | INTRAMUSCULAR | Status: DC | PRN
  Administered 2014-12-17: 30 mL via SUBCUTANEOUS
  Filled 2014-12-17: qty 30

## 2014-12-17 MED ORDER — LACTATED RINGERS IV SOLN
500.0000 mL | INTRAVENOUS | Status: DC | PRN
  Administered 2014-12-17: 500 mL via INTRAVENOUS
  Administered 2014-12-17: 1000 mL via INTRAVENOUS

## 2014-12-17 MED ORDER — TERBUTALINE SULFATE 1 MG/ML IJ SOLN
0.2500 mg | Freq: Once | INTRAMUSCULAR | Status: DC | PRN
Start: 2014-12-17 — End: 2014-12-17
  Filled 2014-12-17: qty 1

## 2014-12-17 MED ORDER — OXYCODONE-ACETAMINOPHEN 5-325 MG PO TABS
2.0000 | ORAL_TABLET | ORAL | Status: DC | PRN
  Administered 2014-12-18: 2 via ORAL
  Filled 2014-12-17: qty 2

## 2014-12-17 MED ORDER — FENTANYL 2.5 MCG/ML BUPIVACAINE 1/10 % EPIDURAL INFUSION (WH - ANES): 14.0000 mL/h | INTRAMUSCULAR | Status: DC | PRN

## 2014-12-17 MED ORDER — FLEET ENEMA 7-19 GM/118ML RE ENEM: 1.0000 | ENEMA | RECTAL | Status: DC | PRN

## 2014-12-17 MED ORDER — ONDANSETRON HCL 4 MG/2ML IJ SOLN: 4.0000 mg | INTRAMUSCULAR | Status: DC | PRN

## 2014-12-17 MED ORDER — SIMETHICONE 80 MG PO CHEW
80.0000 mg | CHEWABLE_TABLET | ORAL | Status: DC | PRN
  Administered 2014-12-18: 80 mg via ORAL
  Filled 2014-12-17: qty 1

## 2014-12-17 MED ORDER — OXYCODONE-ACETAMINOPHEN 5-325 MG PO TABS: 2.0000 | ORAL_TABLET | ORAL | Status: DC | PRN

## 2014-12-17 MED ORDER — PHENYLEPHRINE 40 MCG/ML (10ML) SYRINGE FOR IV PUSH (FOR BLOOD PRESSURE SUPPORT)
80.0000 ug | PREFILLED_SYRINGE | INTRAVENOUS | Status: AC | PRN
  Administered 2014-12-17 (×3): 80 ug via INTRAVENOUS
  Filled 2014-12-17 (×2): qty 20

## 2014-12-17 MED ORDER — METHYLERGONOVINE MALEATE 0.2 MG PO TABS: 0.2000 mg | ORAL_TABLET | ORAL | Status: DC | PRN

## 2014-12-17 MED ORDER — LACTATED RINGERS IV SOLN
INTRAVENOUS | Status: DC
  Administered 2014-12-17 (×2): via INTRAVENOUS

## 2014-12-17 MED ORDER — SODIUM CHLORIDE 0.9 % IV SOLN: 250.0000 mL | INTRAVENOUS | Status: DC | PRN

## 2014-12-17 MED ORDER — LANOLIN HYDROUS EX OINT: TOPICAL_OINTMENT | CUTANEOUS | Status: DC | PRN

## 2014-12-17 MED ORDER — DIPHENHYDRAMINE HCL 25 MG PO CAPS: 25.0000 mg | ORAL_CAPSULE | Freq: Four times a day (QID) | ORAL | Status: DC | PRN

## 2014-12-17 MED ORDER — TETANUS-DIPHTH-ACELL PERTUSSIS 5-2.5-18.5 LF-MCG/0.5 IM SUSP
0.5000 mL | Freq: Once | INTRAMUSCULAR | Status: AC
  Administered 2014-12-18: 0.5 mL via INTRAMUSCULAR

## 2014-12-17 MED ORDER — PRENATAL MULTIVITAMIN CH
1.0000 | ORAL_TABLET | Freq: Every day | ORAL | Status: DC
  Administered 2014-12-18: 1 via ORAL
  Filled 2014-12-17: qty 1

## 2014-12-17 MED ORDER — OXYTOCIN 40 UNITS IN LACTATED RINGERS INFUSION - SIMPLE MED
1.0000 m[IU]/min | INTRAVENOUS | Status: DC
  Administered 2014-12-17: 2 m[IU]/min via INTRAVENOUS

## 2014-12-17 MED ORDER — FERROUS SULFATE 325 (65 FE) MG PO TABS
325.0000 mg | ORAL_TABLET | Freq: Two times a day (BID) | ORAL | Status: DC
  Administered 2014-12-18 – 2014-12-19 (×3): 325 mg via ORAL
  Filled 2014-12-17 (×3): qty 1

## 2014-12-17 MED ORDER — METHYLERGONOVINE MALEATE 0.2 MG/ML IJ SOLN: 0.2000 mg | INTRAMUSCULAR | Status: DC | PRN

## 2014-12-17 MED ORDER — PHENYLEPHRINE 40 MCG/ML (10ML) SYRINGE FOR IV PUSH (FOR BLOOD PRESSURE SUPPORT)
80.0000 ug | PREFILLED_SYRINGE | INTRAVENOUS | Status: AC | PRN
  Administered 2014-12-17 (×3): 80 ug via INTRAVENOUS

## 2014-12-17 MED ORDER — SODIUM CHLORIDE 0.9 % IV SOLN
2.0000 g | Freq: Four times a day (QID) | INTRAVENOUS | Status: DC
  Administered 2014-12-17: 2 g via INTRAVENOUS
  Filled 2014-12-17 (×3): qty 2000

## 2014-12-17 MED ORDER — OXYCODONE-ACETAMINOPHEN 5-325 MG PO TABS
1.0000 | ORAL_TABLET | ORAL | Status: DC | PRN
  Administered 2014-12-18 (×2): 1 via ORAL
  Filled 2014-12-17 (×2): qty 1

## 2014-12-17 MED ORDER — MEASLES, MUMPS & RUBELLA VAC ~~LOC~~ INJ: 0.5000 mL | INJECTION | Freq: Once | SUBCUTANEOUS | Status: DC

## 2014-12-17 MED ORDER — LIDOCAINE HCL (PF) 1 % IJ SOLN
INTRAMUSCULAR | Status: DC | PRN
  Administered 2014-12-17 (×2): 4 mL via EPIDURAL

## 2014-12-17 MED ORDER — DIBUCAINE 1 % RE OINT
1.0000 | TOPICAL_OINTMENT | RECTAL | Status: DC | PRN
Start: 2014-12-17 — End: 2014-12-19

## 2014-12-17 MED ORDER — ACETAMINOPHEN 325 MG PO TABS
650.0000 mg | ORAL_TABLET | ORAL | Status: DC | PRN
  Administered 2014-12-17: 650 mg via ORAL
  Filled 2014-12-17: qty 2

## 2014-12-17 MED ORDER — OXYTOCIN 40 UNITS IN LACTATED RINGERS INFUSION - SIMPLE MED
INTRAVENOUS | Status: AC
  Administered 2014-12-17: 2 m[IU]/min via INTRAVENOUS
  Filled 2014-12-17: qty 1000

## 2014-12-17 MED ORDER — ONDANSETRON HCL 4 MG PO TABS: 4.0000 mg | ORAL_TABLET | ORAL | Status: DC | PRN

## 2014-12-17 MED ORDER — SODIUM CHLORIDE 0.9 % IJ SOLN: 3.0000 mL | INTRAMUSCULAR | Status: DC | PRN

## 2014-12-17 MED ORDER — OXYTOCIN 40 UNITS IN LACTATED RINGERS INFUSION - SIMPLE MED: 62.5000 mL/h | INTRAVENOUS | Status: DC

## 2014-12-17 MED ORDER — ONDANSETRON HCL 4 MG/2ML IJ SOLN
4.0000 mg | Freq: Four times a day (QID) | INTRAMUSCULAR | Status: DC | PRN
Start: 2014-12-17 — End: 2014-12-17

## 2014-12-17 MED ORDER — SODIUM CHLORIDE 0.9 % IV SOLN
2.0000 g | Freq: Once | INTRAVENOUS | Status: AC
  Administered 2014-12-17: 2 g via INTRAVENOUS
  Filled 2014-12-17: qty 2000

## 2014-12-17 MED ORDER — MAGNESIUM HYDROXIDE 400 MG/5ML PO SUSP: 30.0000 mL | ORAL | Status: DC | PRN

## 2014-12-17 MED ORDER — EPHEDRINE 5 MG/ML INJ
10.0000 mg | INTRAVENOUS | Status: DC | PRN
  Filled 2014-12-17: qty 2
  Filled 2014-12-17: qty 4

## 2014-12-17 NOTE — Anesthesia Procedure Notes (Signed)
Epidural Patient location during procedure: OB Start time: 12/17/2014 10:23 AM  Staffing Anesthesiologist: Mal Amabile Performed by: anesthesiologist   Preanesthetic Checklist Completed: patient identified, site marked, surgical consent, pre-op evaluation, timeout performed, IV checked, risks and benefits discussed and monitors and equipment checked  Epidural Patient position: sitting Prep: site prepped and draped and DuraPrep Patient monitoring: continuous pulse ox and blood pressure Approach: midline Location: L3-L4 Injection technique: LOR air  Needle:  Needle type: Tuohy  Needle gauge: 17 G Needle length: 9 cm and 9 Needle insertion depth: 6 cm Catheter type: closed end flexible Catheter size: 19 Gauge Catheter at skin depth: 11 cm Test dose: negative and Other  Assessment Events: blood not aspirated, injection not painful, no injection resistance, negative IV test and no paresthesia  Additional Notes Patient identified. Risks and benefits discussed including failed block, incomplete  Pain control, post dural puncture headache, nerve damage, paralysis, blood pressure Changes, nausea, vomiting, reactions to medications-both toxic and allergic and post Partum back pain. All questions were answered. Patient expressed understanding and wished to proceed. Sterile technique was used throughout procedure. Epidural site was Dressed with sterile barrier dressing. No paresthesias, signs of intravascular injection Or signs of intrathecal spread were encountered.  Patient was more comfortable after the epidural was dosed. Please see RN's note for documentation of vital signs and FHR which are stable.

## 2014-12-17 NOTE — MAU Note (Signed)
Pt c/o contractions every 5-6 mins since 2 am. Denies LOF or vag bleeding. +FM Previous c/s, plans TOLAC. +GBS

## 2014-12-17 NOTE — Anesthesia Preprocedure Evaluation (Signed)
Anesthesia Evaluation  Patient identified by MRN, date of birth, ID band Patient awake    Reviewed: Allergy & Precautions, NPO status , Patient's Chart, lab work & pertinent test results  Airway Mallampati: II  TM Distance: >3 FB Neck ROM: Full    Dental no notable dental hx. (+) Teeth Intact   Pulmonary neg pulmonary ROS,  breath sounds clear to auscultation  Pulmonary exam normal       Cardiovascular Normal cardiovascular examRhythm:Regular Rate:Normal     Neuro/Psych  Headaches, PSYCHIATRIC DISORDERS Depression    GI/Hepatic Neg liver ROS, GERD-  Medicated and Controlled,Hx/o Hirschsprung's Disease S/P colectomy as neonate   Endo/Other  Obesity  Renal/GU negative Renal ROS  negative genitourinary   Musculoskeletal negative musculoskeletal ROS (+)   Abdominal (+) + obese,   Peds  Hematology negative hematology ROS (+)   Anesthesia Other Findings   Reproductive/Obstetrics (+) Pregnancy Previous C/Section failed spinal- GETA                             Anesthesia Physical Anesthesia Plan  ASA: II  Anesthesia Plan: Epidural   Post-op Pain Management:    Induction:   Airway Management Planned: Natural Airway  Additional Equipment:   Intra-op Plan:   Post-operative Plan:   Informed Consent: I have reviewed the patients History and Physical, chart, labs and discussed the procedure including the risks, benefits and alternatives for the proposed anesthesia with the patient or authorized representative who has indicated his/her understanding and acceptance.     Plan Discussed with: Anesthesiologist  Anesthesia Plan Comments:         Anesthesia Quick Evaluation

## 2014-12-17 NOTE — H&P (Signed)
29 y.o. [redacted]w[redacted]d  G2P1001 comes in c/o labor.  Otherwise has good fetal movement and no bleeding.  Pt has history of priod c/s for fetal distress at term.  Desires TOLAC.  Past Medical History  Diagnosis Date  . Chronic salpingitis and oophoritis   . Intrauterine synechiae   . History of Hirschsprung's disease     INFANT --  S/P  SURGICAL REPAIR  . Headache   . Depression     Past Surgical History  Procedure Laterality Date  . Colon surgery  INFANT    HIRSCHSPRUNG'S DISEASE  . Cesarean section  2009  . Laparoscopy N/A 12/09/2013    Procedure: LAPAROSCOPY LYSIS OF ADHESIONS, right partial SALPINGECTOMY, left fimbrioplasty, enterolysis;  Surgeon: Fermin Schwab, MD;  Location: [facility];  Service: Gynecology;  Laterality: N/A;    OB History  Gravida Para Term Preterm AB SAB TAB Ectopic Multiple Living  # Outcome Date GA Lbr Len/2nd Weight Sex Delivery Anes PTL Lv  2 Current           1 Term  [redacted]w[redacted]d    CS-Unspec         Social History   Social History  . Marital Status: Married    Spouse Name: N/A  . Number of Children: N/A  . Years of Education: N/A   Occupational History  . Not on file.   Social History Main Topics  . Smoking status: Never Smoker   . Smokeless tobacco: Never Used  . Alcohol Use: Yes     Comment: OCCASIONAL  . Drug Use: No  . Sexual Activity: Yes     Comment: last intercourse 2-3 wks ago   Other Topics Concern  . Not on file   Social History Narrative   Other and Promethazine    Prenatal Transfer Tool  Maternal Diabetes: No Genetic Screening: Normal Maternal Ultrasounds/Referrals: Normal Fetal Ultrasounds or other Referrals:  None Maternal Substance Abuse:  No Significant Maternal Medications:  None Significant Maternal Lab Results: None  Other PNC: uncomplicated.    Filed Vitals:   12/17/14 0637  BP: 105/72  Pulse: 72  Temp:   Resp:      Lungs/Cor:  NAD Abdomen:  soft, gravid Ex:  no cords,  erythema SVE:  4-5/70/-2, AROm clear FHTs:  130, good STV, NST R Toco:  q 4   A/P   Term labor desires TOLAC.    [facility]BS pos- pcn  Sumiye Hirth A

## 2014-12-17 NOTE — Progress Notes (Signed)
Orders for admission, epidural prn, Ampicillin per [facility]BS protocol

## 2014-12-18 LAB — CBC
HCT: 32.3 % — ABNORMAL LOW (ref 36.0–46.0)
HEMOGLOBIN: 11 g/dL — AB (ref 12.0–15.0)
MCH: 31.4 pg (ref 26.0–34.0)
MCHC: 34.1 g/dL (ref 30.0–36.0)
MCV: 92.3 fL (ref 78.0–100.0)
Platelets: 127 10*3/uL — ABNORMAL LOW (ref 150–400)
RBC: 3.5 MIL/uL — AB (ref 3.87–5.11)
RDW: 14.1 % (ref 11.5–15.5)
WBC: 12.6 10*3/uL — ABNORMAL HIGH (ref 4.0–10.5)

## 2014-12-18 NOTE — Progress Notes (Signed)
Patient is eating, ambulating, voiding.  Pain control is good.  Filed Vitals:   12/17/14 1812 12/17/14 1830 12/17/14 2025 12/17/14 2319  BP: 109/70 105/58 101/63 100/54  Pulse: 108 102 105 93  Temp:  98.2 F (36.8 C) 98.2 F (36.8 C) 98.2 F (36.8 C)  TempSrc:  Oral Oral   Resp:  Height:      Weight:      SpO2:   99% 100%    Fundus firm Perineum without swelling.  Lab Results  Component Value Date   WBC 9.4 12/17/2014   HGB 12.4 12/17/2014   HCT 36.0 12/17/2014   MCV 92.3 12/17/2014   PLT 155 12/17/2014    --/--/B POS (08/12 0705)/RI  A/P Post partum day 1.  Routine care.  Expect d/c routine.    Karlo Goeden A

## 2014-12-18 NOTE — Discharge Summary (Signed)
Obstetric Discharge Summary Reason for Admission: onset of labor Prenatal Procedures: none Intrapartum Procedures: spontaneous vaginal delivery Postpartum Procedures: none Complications-Operative and Postpartum: 2 degree perineal laceration HEMOGLOBIN  Date Value Ref Range Status  12/17/2014 12.4 12.0 - 15.0 g/dL Final   HCT  Date Value Ref Range Status  12/17/2014 36.0 36.0 - 46.0 % Final     Discharge Diagnoses: Term Pregnancy-delivered  Discharge Information: Date: 12/18/2014 Activity: pelvic rest Diet: routine Medications: Ibuprofen Condition: stable Instructions: refer to practice specific booklet Discharge to: home Follow-up Information    Follow up with Shermaine Brigham A, MD In 4 weeks.   Specialty:  Obstetrics and Gynecology   Contact information:   [address] [place] [address] [phone]       Newborn Data: Live born female  Birth Weight: 7 lb 14.8 oz (3595 g) APGAR: 8, 8  Home with mother.  Delante Karapetyan A 12/18/2014, 6:14 AM

## 2014-12-18 NOTE — Anesthesia Postprocedure Evaluation (Signed)
  Anesthesia Post-op Note  Patient: Audrey Vega  Procedure(s) Performed: * No procedures listed *  Patient Location: Mother/Baby  Anesthesia Type:Epidural  Level of Consciousness: awake and alert   Airway and Oxygen Therapy: Patient Spontanous Breathing  Post-op Pain: mild  Post-op Assessment: Post-op Vital signs reviewed, Patient's Cardiovascular Status Stable, Respiratory Function Stable, No signs of Nausea or vomiting, Pain level controlled, No headache, Spinal receding and Patient able to bend at knees              Post-op Vital Signs: Reviewed  Last Vitals:  Filed Vitals:   12/17/14 2319  BP: 100/54  Pulse: 93  Temp: 36.8 C  Resp: 18    Complications: No apparent anesthesia complications

## 2014-12-18 NOTE — Lactation Note (Signed)
This note was copied from the chart of [facility]. Lactation Consultation Note  Patient Name: Audrey Vega WUJWJ'X Date: 12/18/2014 Reason for consult: Initial assessment   With this mom and term baby, now 81 hours old. Mom is having trouble latching the baby, partially due to soft, flat nipples . I assisted mom with latching in cross cradle hold,and tried to latch the baby without a shield. She was searching for the nipple, and did better once 20 shield applied. colostrum was seen in the shield after feeding.     I also showed mom how to hand express into a foley cup, and we cup fed her about 1 ml of colostrum. Basic breastfeeding teaching sone with mo, from the Baby and Me book, and lactation services reviewed. Mom knows to call for questions/concerns.    Maternal Data Formula Feeding for Exclusion: No Has patient been taught Hand Expression?: Yes Does the patient have breastfeeding experience prior to this delivery?: No  Feeding Feeding Type: Breast Fed Length of feed: 15 min (on and off - colostrum seen in nipple shield)  LATCH Score/Interventions Latch: Repeated attempts needed to sustain latch, nipple held in mouth throughout feeding, stimulation needed to elicit sucking reflex. (baby searching for  nipple, latched well with 20 nipple shiled, took a minute to begin sucking) Intervention(s): Skin to skin;Teach feeding cues;Waking techniques Intervention(s): Adjust position;Assist with latch;Breast compression  Audible Swallowing: A few with stimulation Intervention(s): Skin to skin;Hand expression Intervention(s): Skin to skin;Hand expression  Type of Nipple: Flat (20 NS) Intervention(s): Hand pump  Comfort (Breast/Nipple): Soft / non-tender     Hold (Positioning): Assistance needed to correctly position infant at breast and maintain latch. Intervention(s): Breastfeeding basics reviewed;Support Pillows;Position options;Skin to skin  LATCH Score:  6  Lactation Tools Discussed/Used Tools: Nipple Shields Nipple shield size: 20;16 (20 a better fit) Date initiated:: 12/18/14   Consult Status Consult Status: Follow-up Date: 12/19/14 Follow-up type: In-patient    Alfred Levins 12/18/2014, 11:40 AM

## 2014-12-19 NOTE — Clinical Social Work Maternal (Signed)
  CLINICAL SOCIAL WORK MATERNAL/CHILD NOTE  Patient Details  Name: Audrey Vega MRNefertari Rebmanate of Birth: 01/05/1986  Date:  12/19/2014  Clinical Social Worker Initiating Note:  Johnnye Lana, LCSW Date/ Time Initiated:  12/19/14/0845     Child's Name:  Audrey Vega   Legal Guardian:   (Parents )   Need for Interpreter:  None   Date of Referral:  12/18/14     Reason for Referral:  Other (Comment)   Referral Source:  Central Nursery   Address:  [name]  [place], [place] [address]  Phone number:   [phone])   Household Members:  Minor Children, Spouse   Natural Supports (not living in the home):  Extended Family, Immediate Family   Professional Supports:     Employment:  (Spouse is employed)   Type of Work:     Education:      Architect:  [employer]   Other Resources:  [employer]   Cultural/Religious Considerations Which May Impact Care:  none noted Strengths:  Ability to meet basic needs , Home prepared for child    Risk Factors/Current Problems:  None   Cognitive State:  Able to Concentrate , Alert    Mood/Affect:      CSW Assessment:  Acknowledged order for Social Work consult to assess mother's history of depression. MOB was pleasant and receptive to CSW.  Parents are married and have one other dependent age 14.   Mother acknowledged hx of depression and states that she was treated with medication and went to therapy for a short period of time.  She also notes that her husband was very supportive.  She reports no current symptoms or recurring symptoms since she was treated 4 years ago.  She also denies any hx of PP Depression.  Mother denies any hx of substance abuse.  No acute social concerns related at this time.   Mother informed of social work Surveyor, mining.    CSW Plan/Description:  Child Protective Service Report   Spoke with her about the signs/symptoms of PP Depression, and available resources No current barriers to  discharge   Audrey Vega J, LCSW 12/19/2014, 4:12 PM

## 2014-12-19 NOTE — Lactation Note (Signed)
This note was copied from the chart of Audrey [facility]. Lactation Consultation Note  Patient Name: Audrey Vega WUJWJ'X Date: 12/19/2014 Reason for consult: Follow-up assessment   With this mom and term baby, now 82 hours old. The baby was at 6% weight loss at about 30 hours of life. Mom is doing well with applying nipple shiled, and lots of transitional milk/colostrum seen in shield, and around baby's mouth, after feeding. Mom's breasts are much fuller than yesterday. I gave mom another 20 nipple shield to take home, and made her an o/p consult appointment for 8/17. Mom knows to call for questions/concerns.    Maternal Data    Feeding Feeding Type: Breast Fed  LATCH Score/Interventions Latch: Grasps breast easily, tongue down, lips flanged, rhythmical sucking.  Audible Swallowing: Spontaneous and intermittent  Type of Nipple: Flat Intervention(s): Hand pump  Comfort (Breast/Nipple): Soft / non-tender     Hold (Positioning): No assistance needed to correctly position infant at breast. Intervention(s): Breastfeeding basics reviewed;Support Pillows;Position options  LATCH Score: 9  Lactation Tools Discussed/Used Nipple shield size: 20   Consult Status Consult Status: Follow-up Date: 12/22/14 Follow-up type: Out-patient    Alfred Levins 12/19/2014, 8:28 AM

## 2014-12-19 NOTE — Progress Notes (Signed)
Patient is eating, ambulating, voiding.  Pain control is good.  Filed Vitals:   12/17/14 2025 12/17/14 2319 12/18/14 1834 12/19/14 0558  BP: 101/63 100/54 101/67 104/69  Pulse: 105 93 81 75  Temp: 98.2 F (36.8 C) 98.2 F (36.8 C) 97.6 F (36.4 C) 98.2 F (36.8 C)  TempSrc: Oral  Oral Oral  Resp: Height:      Weight:      SpO2: 99% 100%      Fundus firm Perineum without swelling.  Lab Results  Component Value Date   WBC 12.6* 12/18/2014   HGB 11.0* 12/18/2014   HCT 32.3* 12/18/2014   MCV 92.3 12/18/2014   PLT 127* 12/18/2014    --/--/B POS (08/12 0705)/RI  A/P Post partum day 2.  Routine care.  Expect d/c today.    Alantis Bethune A

## 2014-12-21 LAB — TYPE AND SCREEN
ABO/RH(D): B POS
ANTIBODY SCREEN: NEGATIVE
UNIT DIVISION: 0
Unit division: 0

## 2014-12-22 ENCOUNTER — Ambulatory Visit: Payer: Self-pay

## 2014-12-22 NOTE — Lactation Note (Addendum)
This note was copied from the chart of [name]. Lactation Consult  Mother's reason for visit:  Using a NS and SN Visit Type:  op Appointment Notes:  Mom is here today to follow-up with NS use.  Her nipples are sore and the latch is painful.  I assisted mom with better positioning and she reported greater comfort.  Baby transferred 60 ml. Baby's mouth was tight posteriorly but with tongue exercises she relaxed. Instructed mom to post pump 4-6 times a day to protect milk supply. Rented double electric breast pump for 2 weeks to aid in protecting milk supply related to NS use. Follow-up planned December 29, 2014. Consult:  Initial Lactation Consultant:  [name]  ________________________________________________________________________ Audrey Vega Name: Audrey Vega Date of Birth: [birth year] Pediatrician: [name] Gender: female Gestational Age: <None> (At Birth) Birth Weight:  Weight at Discharge: Weight: 3344 ozDate of Discharge: 12/19/2014 [facility] Weights   12/17/14 0626 12/17/14 0833  Weight: 3344 oz 3344 oz   [facility] weight taken from [name]: 7lb 1oz Location:Pediatrician's office Weight today: 7lb 8.3oz (3410 G)      ________________________________________________________________________  Mother's Name: Audrey Vega Type of delivery:  Vaginal, Spontaneous Delivery Breastfeeding Experience:  1st Maternal Medical Conditions:  NA Maternal Medications:  Prenatal vitamin  ________________________________________________________________________  Breastfeeding History (Post Discharge)  Frequency of breastfeeding:  Every 2-3 hours Duration of feeding:  10-30 minutes  Supplementation    Mom reports that baby has had one bottle of her milk since birth    Pumping   Type of pump:  Evenflo electric Obtained a 2 week loaner Frequency:  2-3 times a  day Volume:  60-90-ml  Infant Intake and Output Assessment  Voids:  6+ in 24 hrs.  Color:  Clear yellow Stools:  3+ in 24 hrs.  Color:  Yellow  ________________________________________________________________________  Maternal Breast Assessment  Breast:  Full Nipple:  Flat and Reddened Pain level:  9 Pain interventions:  adjusted latch and used nipple shield; then a pain of 0  _______________________________________________________________________ Feeding Assessment/Evaluation  Initial feeding assessment:    Positioning:  Football Left breast  LATCH documentation:  Tools:  Nipple shield 20 mm Instructed on use and cleaning of tool:  Yes.    Pre-feed weight:  3410 g   Post-feed weight:  3470 g  Amount transferred:  60 ml

## 2014-12-28 ENCOUNTER — Telehealth (HOSPITAL_COMMUNITY): Payer: Self-pay | Admitting: Lactation Services

## 2014-12-28 NOTE — Telephone Encounter (Addendum)
Patient called with concerns related to sore nipples and tender breast. She is concerned that she may have mastitis. Patient is talkative on the phone, has not assessed her temperature, states her left breast "hurts",  Breast is a little pink and feels warm to touch. She reports her right nipple is painful, cracked and she is not able to latch even with a nipple shield due to pain. Mother is feeding baby by bottle, giving expressed milk and formula. Mother has a DEBP. She is concerned because she is only expressing 45 ml and she feels her supply has decreased. Baby is getting formula in addition to her milk. She has reduced pumping sessions due to "not getting much milk". Discussed supply and demand and instructed mother to pump q 3 hours and keep log of pumping/ feeding / I&O diary for the [facility] to see at the appointment tomorrow at 9 am. Advised mother to apply warm compressed to breast, pump followed by hand expression to empty the breast. If fever greater than 100.1 to call MD. Mother agreeable.

## 2014-12-29 ENCOUNTER — Ambulatory Visit (HOSPITAL_COMMUNITY)
Admission: RE | Admit: 2014-12-29 | Discharge: 2014-12-29 | Disposition: A | Discharge status: Home / Self Care | Payer: Medicaid Other | Source: Ambulatory Visit | Attending: Obstetrics and Gynecology | Admitting: Obstetrics and Gynecology

## 2014-12-29 DIAGNOSIS — N644 Mastodynia: Secondary | ICD-10-CM | POA: Insufficient documentation

## 2014-12-29 NOTE — Lactation Note (Signed)
Lactation Consult  Mother's reason for visit:  Mom her todayt because of very sore nipples and pain in the breast Visit Type:  Feeding assessment Appointment Notes:  Using NS Consult:  Follow-Up Lactation Consultant:  Pamelia Hoit  ________________________________________________________________________  Audrey Vega Name: Audrey Vega Date of Birth: 12/17/2014 Pediatrician: S[facility] Gender: female Gestational Age: [redacted]w[redacted]d (At Birth) Birth Weight: 7 lb 14.8 oz (3595 g) Weight at Discharge: Weight: 7 lb 7.8 oz (3395 g)Date of Discharge: 12/19/2014 Filed Weights   12/17/14 1607 12/17/14 2300 12/19/14 0000  Weight: 7 lb 14.8 oz (3595 g) 7 lb 13.8 oz (3565 g) 7 lb 7.8 oz (3395 g)     Weight today 379 0g  8 lbs 5.7 oz  Mom complaining of very sore nipples. Left one worse- both nipples pink and left one is very raw on tip with some healing scabs. Reports sore nipples started on Saturday before that breast feeding had been going well. Reports on Monday she had a lot of pain in the breast and it was red and she kept feeling hot and cold and "wobbly". Does not look red now but she still reports pain in breast. Did not want to latch baby to that breast because it is so sore. Baby latched well, after untucking bottom lip. Mom reports a lot of pain with initial patch but then eases off. Mom has been mostly pumping and bottle feeding EBM but did have to get formula because she did not have enough pumped milk. Is only pumping 2-3 times/day. Encouraged to pump q 3 hours to promote milk supply. Mom reports she has a history of yeast infections- had several while pregnant Does have some burning pain on nipples. Encouraged to boil all pump parts, bottles, nipples and bra. Encouraged to call OB about symptoms- ?yeast on nipples and poss mastitis. No further questions at present. To call prn ________________________________________________________________________  Mother's  Name: Audrey Vega Breastfeeding Experience:  Pumped for a few Female Minish for first baby- diff latch  Maternal Medications:  Motrin  ________________________________________________________________________  Breastfeeding History (Post Discharge)  Frequency of breastfeeding:  Has been pumping and bottle feeding a lot since her nipples were so sore   Supplementation  Formula:  Volume 60 ml Frequency:  When she doesn't have enough pumped milk      Brand: Daron Offer  Breastmilk:  Volume 60-90 ml Frequency:  q 2-3 hours  Method:  Bottle,   Pumping  Type of pump:  WIC pump Frequency:  2-3 times/day Volume:  60-90 ml  Infant Intake and Output Assessment  Voids:  15 in 24 hrs.  Color:  Clear yellow   Had 2 voids while here for appointment Stools:  12-15  in 24 hrs.  Color:  Yellow  ________________________________________________________________________  Maternal Breast Assessment  Breast:  Filling Nipple:  Flat and Reddened Pain level:  8 with initial latch but then eases off to about 3 Pain interventions:  Expressed breast milk  _______________________________________________________________________ Feeding Assessment/Evaluation  Initial feeding assessment:  I  Positioning:  Football Right breast  LATCH documentation:  Latch:  2 = Grasps breast easily, tongue down, lips flanged, rhythmical sucking.  Audible swallowing:  2 = Spontaneous and intermittent  Type of nipple:  1 = Flat  Comfort (Breast/Nipple):  0 = Engorged, cracked, bleeding, large blisters, severe discomfort  Hold (Positioning):  1 = Assistance needed to correctly position infant at breast and maintain latch  LATCH score:  6  Attached assessment:  Deep  Lips flanged:  Yes.    Lips untucked:  No.  Suck assessment:  Nutritive  Tools:  Nipple shield 20 mm Instructed on use and cleaning of tool:  Yes.    Pre-feed weight:  3790 g  (8 lb. 5.7 oz.) Post-feed weight:  3854 g (8 lb. 7.9  oz.) Amount transferred:  64 ml Amount supplemented:  0 ml   Total amount transferred:  64  ml Total supplement given:  0 ml

## 2015-01-18 ENCOUNTER — Other Ambulatory Visit: Payer: Self-pay | Admitting: Obstetrics and Gynecology

## 2015-01-19 LAB — CYTOLOGY - PAP

## 2015-08-03 ENCOUNTER — Ambulatory Visit (INDEPENDENT_AMBULATORY_CARE_PROVIDER_SITE_OTHER): Payer: BLUE CROSS/BLUE SHIELD | Admitting: Family Medicine

## 2015-08-03 VITALS — BP 110/92 | HR 109 | Temp 98.5°F | Resp 18 | Ht 63.0 in | Wt 193.0 lb

## 2015-08-03 DIAGNOSIS — E860 Dehydration: Secondary | ICD-10-CM | POA: Diagnosis not present

## 2015-08-03 DIAGNOSIS — K047 Periapical abscess without sinus: Secondary | ICD-10-CM

## 2015-08-03 DIAGNOSIS — R109 Unspecified abdominal pain: Secondary | ICD-10-CM

## 2015-08-03 DIAGNOSIS — R10A0 Flank pain, unspecified side: Secondary | ICD-10-CM

## 2015-08-03 DIAGNOSIS — R197 Diarrhea, unspecified: Secondary | ICD-10-CM

## 2015-08-03 DIAGNOSIS — A09 Infectious gastroenteritis and colitis, unspecified: Secondary | ICD-10-CM

## 2015-08-03 DIAGNOSIS — R1030 Lower abdominal pain, unspecified: Secondary | ICD-10-CM

## 2015-08-03 DIAGNOSIS — R509 Fever, unspecified: Secondary | ICD-10-CM

## 2015-08-03 DIAGNOSIS — R42 Dizziness and giddiness: Secondary | ICD-10-CM

## 2015-08-03 LAB — POCT URINALYSIS DIP (MANUAL ENTRY)
BILIRUBIN UA: NEGATIVE
Bilirubin, UA: NEGATIVE
Glucose, UA: NEGATIVE
Leukocytes, UA: NEGATIVE
Nitrite, UA: NEGATIVE
PH UA: 5.5
RBC UA: NEGATIVE
SPEC GRAV UA: 1.02
UROBILINOGEN UA: 0.2

## 2015-08-03 LAB — POC MICROSCOPIC URINALYSIS (UMFC): Mucus: ABSENT

## 2015-08-03 LAB — POCT INFLUENZA A/B
INFLUENZA A, POC: NEGATIVE
INFLUENZA B, POC: NEGATIVE

## 2015-08-03 MED ORDER — HYDROCODONE-ACETAMINOPHEN 5-325 MG PO TABS: 1.0000 | ORAL_TABLET | ORAL | Status: DC | PRN

## 2015-08-03 MED ORDER — ONDANSETRON 8 MG PO TBDP: 8.0000 mg | ORAL_TABLET | Freq: Three times a day (TID) | ORAL | Status: DC | PRN

## 2015-08-03 MED ORDER — PENICILLIN V POTASSIUM 500 MG PO TABS: 500.0000 mg | ORAL_TABLET | Freq: Four times a day (QID) | ORAL | Status: DC

## 2015-08-03 MED ORDER — ONDANSETRON 4 MG PO TBDP
8.0000 mg | ORAL_TABLET | Freq: Once | ORAL | Status: AC
  Administered 2015-08-03: 8 mg via ORAL

## 2015-08-03 NOTE — Patient Instructions (Addendum)
   IF you received an x-ray today, you will receive an invoice from [facility]. Please contact [facility] at [phone] with questions or concerns regarding your invoice.   IF you received labwork today, you will receive an invoice from [facility]. Please contact [facility] at [phone] with questions or concerns regarding your invoice.   Our billing staff will not be able to assist you with questions regarding bills from these companies.  You will be contacted with the lab results as soon as they are available. The fastest way to get your results is to activate your My Chart account. Instructions are located on the last page of this paperwork. If you have not heard from us regarding the results in 2 weeks, please contact this office.      Dehydration, Adult Dehydration is a condition in which you do not have enough fluid or water in your body. It happens when you take in less fluid than you lose. Vital organs such as the kidneys, brain, and heart cannot function without a proper amount of fluids. Any loss of fluids from the body can cause dehydration.  Dehydration can range from mild to severe. This condition should be treated right away to help prevent it from becoming severe. CAUSES  This condition may be caused by:  Vomiting.  Diarrhea.  Excessive sweating, such as when exercising in hot or humid weather.  Not drinking enough fluid during strenuous exercise or during an illness.  Excessive urine output.  Fever.  Certain medicines. RISK FACTORS This condition is more likely to develop in:  People who are taking certain medicines that cause the body to lose excess fluid (diuretics).   People who have a chronic illness, such as diabetes, that may increase urination.  Older adults.   People who live at high altitudes.   People who participate in endurance sports.  SYMPTOMS  Mild Dehydration  Thirst.  Dry  lips.  Slightly dry mouth.  Dry, warm skin. Moderate Dehydration  Very dry mouth.   Muscle cramps.   Dark urine and decreased urine production.   Decreased tear production.   Headache.   Light-headedness, especially when you stand up from a sitting position.  Severe Dehydration  Changes in skin.   Cold and clammy skin.   Skin does not spring back quickly when lightly pinched and released.   Changes in body fluids.   Extreme thirst.   No tears.   Not able to sweat when body temperature is high, such as in hot weather.   Minimal urine production.   Changes in vital signs.   Rapid, weak pulse (more than 100 beats per minute when you are sitting still).   Rapid breathing.   Low blood pressure.   Other changes.   Sunken eyes.   Cold hands and feet.   Confusion.  Lethargy and difficulty being awakened.  Fainting (syncope).   Short-term weight loss.   Unconsciousness. DIAGNOSIS  This condition may be diagnosed based on your symptoms. You may also have tests to determine how severe your dehydration is. These tests may include:   Urine tests.   Blood tests.  TREATMENT  Treatment for this condition depends on the severity. Mild or moderate dehydration can often be treated at home. Treatment should be started right away. Do not wait until dehydration becomes severe. Severe dehydration needs to be treated at the hospital. Treatment for Mild Dehydration  Drinking plenty of water to replace the fluid you have lost.     Replacing minerals in your blood (electrolytes) that you may have lost.  Treatment for Moderate Dehydration  Consuming oral rehydration solution (ORS). Treatment for Severe Dehydration  Receiving fluid through an IV tube.   Receiving electrolyte solution through a feeding tube that is passed through your nose and into your stomach (nasogastric tube or NG tube).  Correcting any abnormalities in  electrolytes. HOME CARE INSTRUCTIONS   Drink enough fluid to keep your urine clear or pale yellow.   Drink water or fluid slowly by taking small sips. You can also try sucking on ice cubes.  Have food or beverages that contain electrolytes. Examples include bananas and sports drinks.  Take over-the-counter and prescription medicines only as told by your health care provider.   Prepare ORS according to the manufacturer's instructions. Take sips of ORS every 5 minutes until your urine returns to normal.  If you have vomiting or diarrhea, continue to try to drink water, ORS, or both.   If you have diarrhea, avoid:   Beverages that contain caffeine.   Fruit juice.   Milk.   Carbonated soft drinks.  Do not take salt tablets. This can lead to the condition of having too much sodium in your body (hypernatremia).  SEEK MEDICAL CARE IF:  You cannot eat or drink without vomiting.  You have had moderate diarrhea during a period of more than 24 hours.  You have a fever. SEEK IMMEDIATE MEDICAL CARE IF:   You have extreme thirst.  You have severe diarrhea.  You have not urinated in 6-8 hours, or you have urinated only a small amount of very dark urine.  You have shriveled skin.  You are dizzy, confused, or both.   This information is not intended to replace advice given to you by your health care provider. Make sure you discuss any questions you have with your health care provider.   Document Released: 04/23/2005 Document Revised: 01/12/2015 Document Reviewed: 09/08/2014 Elsevier Interactive Patient Education 2016 [employer]  

## 2015-08-03 NOTE — Progress Notes (Addendum)
Subjective:  This chart was scribed for  Norberto Sorenson MD, by Veverly Fells, at [facility].  This patient was seen in room 3 and the patient's care was started at 12:48 PM.   Chief Complaint  Patient presents with  . Emesis    Symptoms occured after having root canal on Monday  . Diarrhea  . Flank Pain  . Fatigue  . Nausea     Patient ID: Audrey Vega, female    DOB: 1985/09/05, 30 y.o.   MRN: [id number]  HPI  HPI Comments: Audrey Vega is a 30 y.o. female who presents to the [facility] complaining of diarrhea, flank pain and nausea/vomitting.  Patient had a root canal done two days ago and states that she had an infection.  They told her to take ibuprofen and wait for the pain to subside.  She has not been able to eat anything since Monday. She had a 103.1 fever last night and states that when she does eat anything, she gets nauseas and throws up which then leads to diarrhea.  She has received her flu shot this year.  She also has flank/abdominal pain when her nausea begins.  Her kids and husband have had colds recently but states that it was nothing significant.  Patient works with processed pulled pork and had to leave work today as she felt like she was going to pass out. Patient took ibuprofen, Pepto bismol, alka seltzer and Tums but denies any significant relief. She is compliant with her birth control.    Patient Active Problem List   Diagnosis Date Noted  . Indication for care in labor or delivery 12/17/2014  . Postpartum state 12/17/2014   Past Medical History  Diagnosis Date  . Chronic salpingitis and oophoritis   . Intrauterine synechiae   . History of Hirschsprung's disease     INFANT --  S/P  SURGICAL REPAIR  . Headache   . Depression    Past Surgical History  Procedure Laterality Date  . Colon surgery  INFANT    HIRSCHSPRUNG'S DISEASE  . Cesarean section  2009  . Laparoscopy N/A 12/09/2013    Procedure:  LAPAROSCOPY LYSIS OF ADHESIONS, right partial SALPINGECTOMY, left fimbrioplasty, enterolysis;  Surgeon: Fermin Schwab, MD;  Location: [facility];  Service: Gynecology;  Laterality: N/A;   Allergies  Allergen Reactions  . Other Itching    Vick's vapor rub  . Promethazine Other (See Comments)    Tardive dyskinesia   Prior to Admission medications   Medication Sig Start Date End Date Taking? Authorizing Provider  norgestimate-ethinyl estradiol (ORTHO-CYCLEN,SPRINTEC,PREVIFEM) 0.25-35 MG-MCG tablet Take 1 tablet by mouth daily.   Yes Historical Provider, MD  Prenatal Vit-Fe Fumarate-FA (PRENATAL MULTIVITAMIN) TABS tablet Take 1 tablet by mouth daily at 12 noon. Reported on 08/03/2015    Historical Provider, MD   Social History   Social History  . Marital Status: Married    Spouse Name: N/A  . Number of Children: N/A  . Years of Education: N/A   Occupational History  . Not on file.   Social History Main Topics  . Smoking status: Never Smoker   . Smokeless tobacco: Never Used  . Alcohol Use: Yes     Comment: OCCASIONAL  . Drug Use: No  . Sexual Activity: Yes     Comment: last intercourse 2-3 wks ago   Other Topics Concern  . Not on file   Social History Narrative  Review of Systems  Constitutional: Positive for fever and chills.  HENT: Positive for dental problem.   Eyes: Negative for pain, redness and itching.  Respiratory: Negative for cough, choking and shortness of breath.   Cardiovascular: Negative for chest pain.  Gastrointestinal: Positive for nausea, vomiting, abdominal pain and diarrhea.  Genitourinary: Positive for flank pain.  Musculoskeletal: Negative for neck pain and neck stiffness.  Skin: Negative for color change.  Neurological: Negative for seizures, syncope and speech difficulty.       Objective:   Physical Exam  Constitutional: She appears well-developed.  HENT:  Head: Normocephalic and atraumatic.  Nose: Nose normal.   Mouth/Throat: No oropharyngeal exudate.  Ears and nares are normal.   Eyes: Pupils are equal, round, and reactive to light.  Neck: No thyromegaly present.  Some submandibular adenopathy bilaterally.  No supraclavicular or posterior cervical adenopathy.   Cardiovascular: Regular rhythm.  Tachycardia present.   Pulmonary/Chest: Effort normal and breath sounds normal. No respiratory distress. She has no wheezes. She has no rales.  Abdominal: Soft. She exhibits no distension. There is tenderness in the right upper quadrant and suprapubic area. There is CVA tenderness.  Positive CVA tenderness bilaterally.  Some hyperactive bowel sounds    BP 110/92 mmHg  Pulse 109  Temp(Src) 98.5 F (36.9 C) (Oral)  Resp 18  Ht  (1.6 m)  Wt 193 lb (87.544 kg)  BMI 34.20 kg/m2  SpO2 99%  Orthostatic VS neg    Results for orders placed or performed in visit on 08/03/15  POCT urinalysis dipstick  Result Value Ref Range   Color, UA yellow yellow   Clarity, UA clear clear   Glucose, UA negative negative   Bilirubin, UA negative negative   Ketones, POC UA negative negative   Spec Grav, UA 1.020    Blood, UA negative negative   pH, UA 5.5    Protein Ur, POC =30 (A) negative   Urobilinogen, UA 0.2    Nitrite, UA Negative Negative   Leukocytes, UA Negative Negative  POCT Microscopic Urinalysis ([facility])  Result Value Ref Range   WBC,UR,HPF,POC None None WBC/hpf   RBC,UR,HPF,POC None None RBC/hpf   Bacteria Few (A) None, Too numerous to count   Mucus Absent Absent   Epithelial Cells, UR Per Microscopy Few (A) None, Too numerous to count cells/hpf  POCT Influenza A/B  Result Value Ref Range   Influenza A, POC Negative Negative   Influenza B, POC Negative Negative  POC Hemoccult Bld/Stl (3-Cd Home Screen)  Result Value Ref Range   Card #1 Date     Fecal Occult Blood, POC Positive (A) Negative   Card #2 Date     Card #2 Fecal Occult Blod, POC Positive    Card #3 Date     Card #3 Fecal  Occult Blood, POC Positive     Assessment & Plan:   1. Diarrhea of presumed infectious origin   2. Fever, unspecified   3. Lightheaded   4. Dehydration   5. Flank pain   6. Lower abdominal pain   7. Dental infection - worsening pain since root canal last wk and only has tylenol which has not helped her pain at all - start pcn w/ prn hydrocodone.  Unable to start IV - pt feeling better after zofran and able to keep a bottle of water down - push fluids. RTC tomorrow if not sig improving. Drop off UA and stool studies.  Pt very much wants to go back to  work today so advised no heavy lifing, stooping, bending and need to take freq water breaks.  Orders Placed This Encounter  Procedures  . Stool culture  . Clostridium Difficile by PCR    Order Specific Question:  Is your patient experiencing loose or watery stools (3 or more in 24 hours)?    Answer:  Yes    Order Specific Question:  Has the patient received laxatives in the last 24 hours?    Answer:  No    Order Specific Question:  Has a negative Cdiff test resulted in the last 7 days?    Answer:  No  . Fecal lactoferrin  . Orthostatic vital signs  . POCT urinalysis dipstick  . POCT Microscopic Urinalysis ([facility])  . POCT Influenza A/B  . POC Hemoccult Bld/Stl (3-Cd Home Screen)    Standing Status: Future     Number of Occurrences: 1     Standing Expiration Date: 08/02/2016  . Insert peripheral IV    Meds ordered this encounter  Medications  . norgestimate-ethinyl estradiol (ORTHO-CYCLEN,SPRINTEC,PREVIFEM) 0.25-35 MG-MCG tablet    Sig: Take 1 tablet by mouth daily.  . ondansetron (ZOFRAN-ODT) disintegrating tablet 8 mg    Sig:   . ondansetron (ZOFRAN-ODT) 8 MG disintegrating tablet    Sig: Take 1 tablet (8 mg total) by mouth every 8 (eight) hours as needed for nausea.    Dispense:  30 tablet    Refill:  0  . HYDROcodone-acetaminophen (NORCO/VICODIN) 5-325 MG tablet    Sig: Take 1-2 tablets by mouth every 4 (four) hours as  needed for moderate pain.    Dispense:  30 tablet    Refill:  0  . penicillin v potassium (VEETID) 500 MG tablet    Sig: Take 1 tablet (500 mg total) by mouth 4 (four) times daily.    Dispense:  56 tablet    Refill:  0    I personally performed the services described in this documentation, which was scribed in my presence. The recorded information has been reviewed and considered, and addended by me as needed.  Norberto SorensonEva Shaw, MD MPH

## 2015-08-04 LAB — POC HEMOCCULT BLD/STL (HOME/3-CARD/SCREEN)
Card #2 Fecal Occult Blod, POC: POSITIVE
FECAL OCCULT BLD: POSITIVE
Fecal Occult Blood, POC: POSITIVE — AB

## 2015-08-05 LAB — FECAL LACTOFERRIN, QUANT: LACTOFERRIN: POSITIVE

## 2015-08-05 LAB — CLOSTRIDIUM DIFFICILE BY PCR: Toxigenic C. Difficile by PCR: NOT DETECTED

## 2015-08-08 LAB — STOOL CULTURE

## 2015-08-12 NOTE — Addendum Note (Signed)
Addended by: Norberto SorensonSHAW, EVA on: 08/12/2015 06:22 PM   Modules accepted: Orders

## 2016-03-04 IMAGING — US US OB COMP LESS 14 WK
1 series · 14 of 28 positions shown · non-contrast
Comparison: None.

CLINICAL DATA: Vaginal bleeding

EXAM:
OBSTETRIC <14 WK US AND TRANSVAGINAL OB US
TECHNIQUE: Both transabdominal and transvaginal ultrasound examinations were
performed for complete evaluation of the gestation as well as the
maternal uterus, adnexal regions, and pelvic cul-de-sac.
Transvaginal technique was performed to assess early pregnancy.

[Series 1: us ob comp add'left gest less 14 wks · 54 acquisitions, 14 frames shown]
[im 2/54]
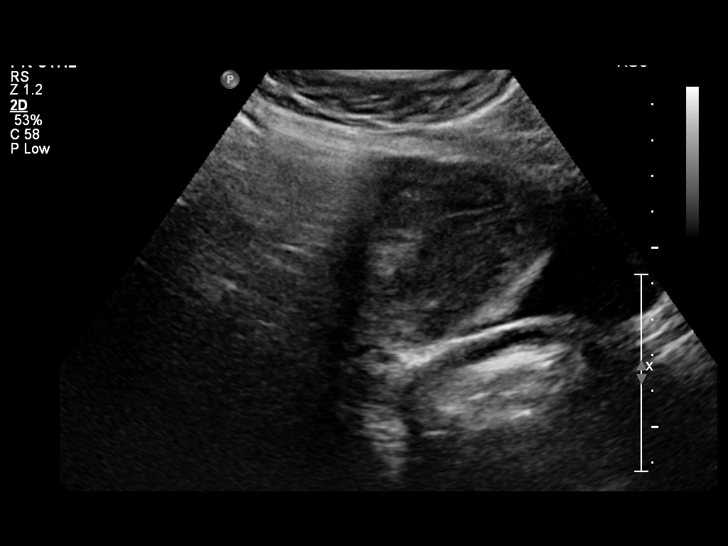
[im 6/54]
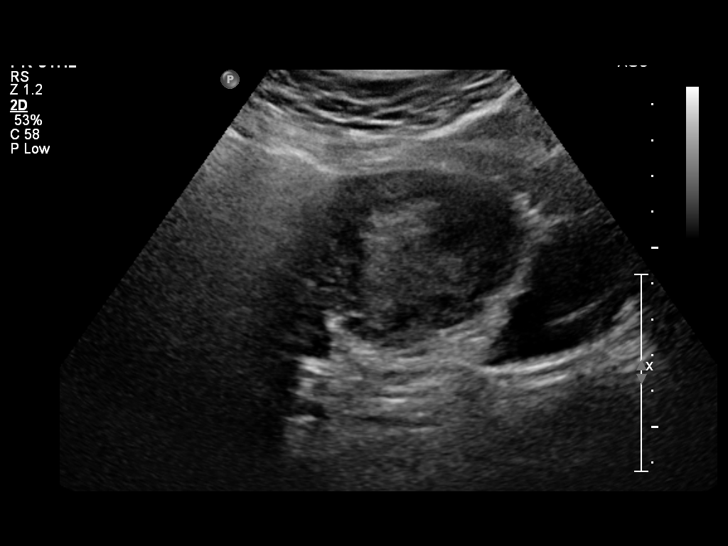
[im 10/54]
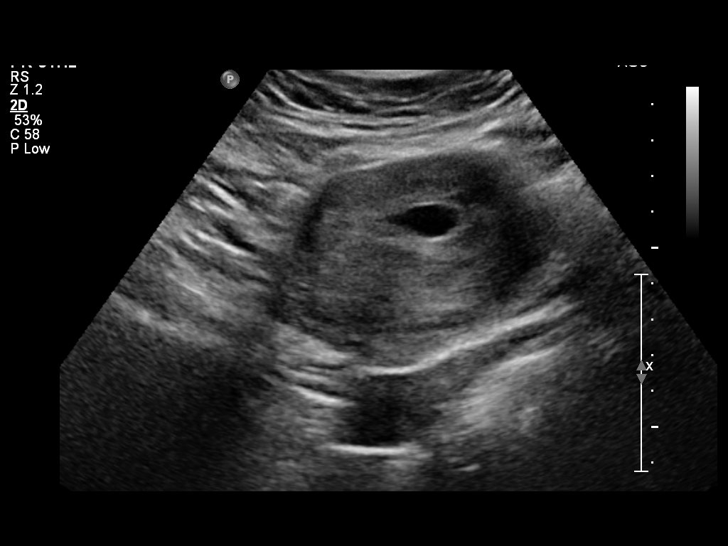
[im 14/54]
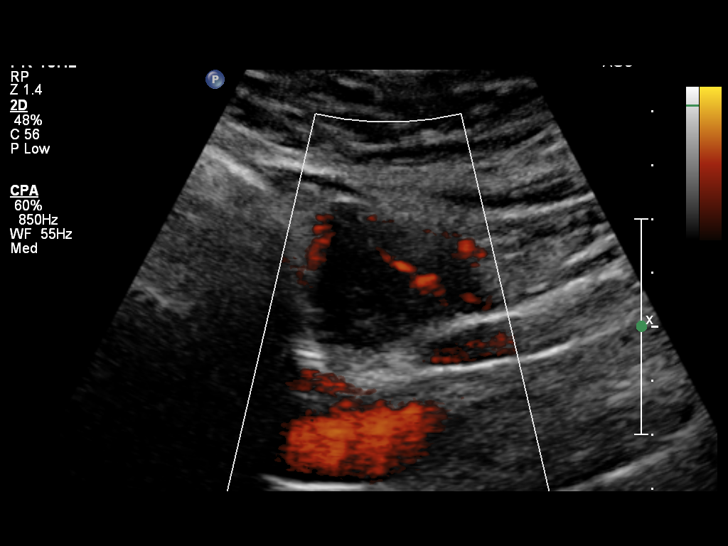
[im 18/54]
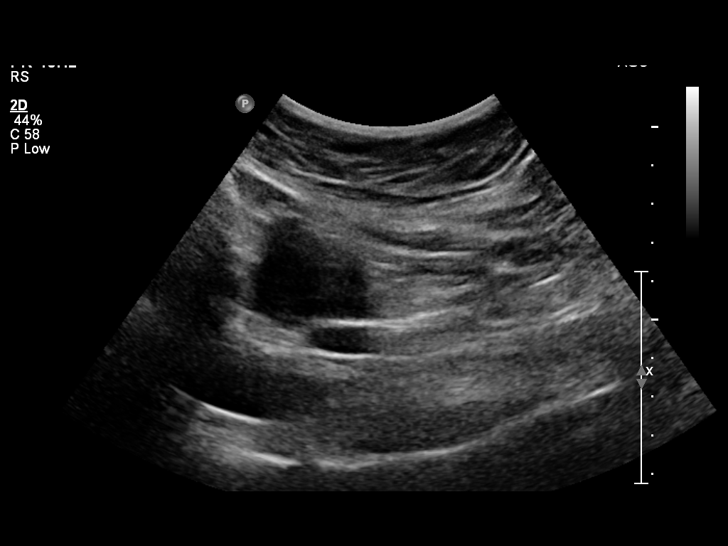
[im 22/54]
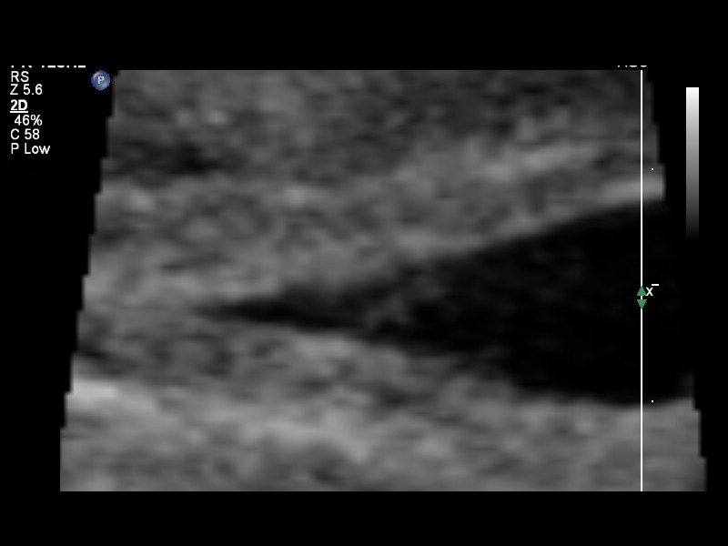
[im 26/54]
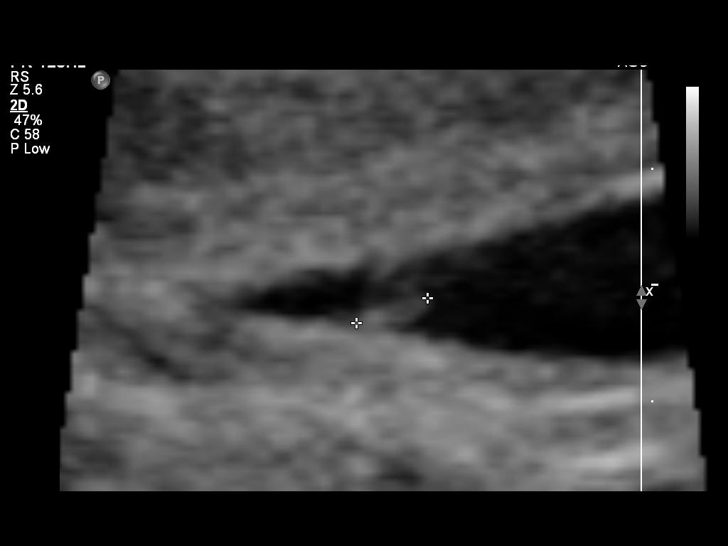
[im 30/54]
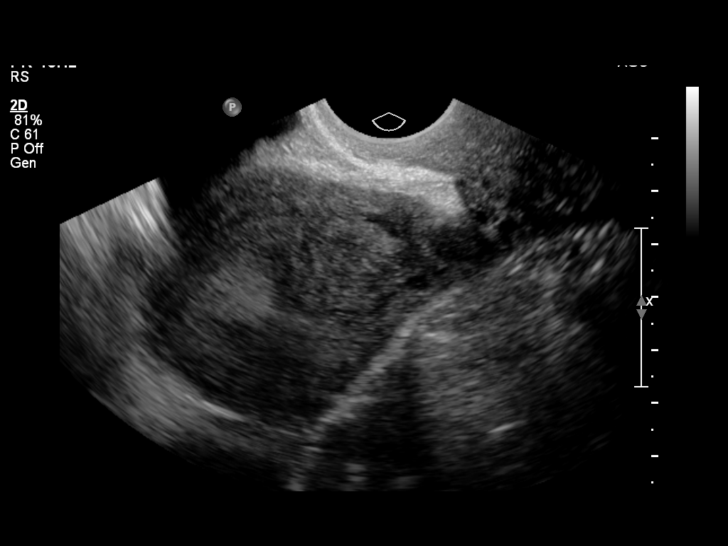
[im 34/54]
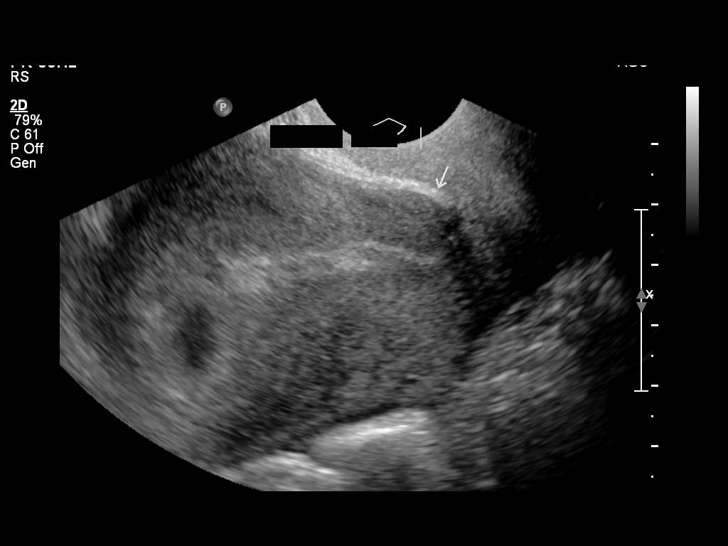
[im 38/54]
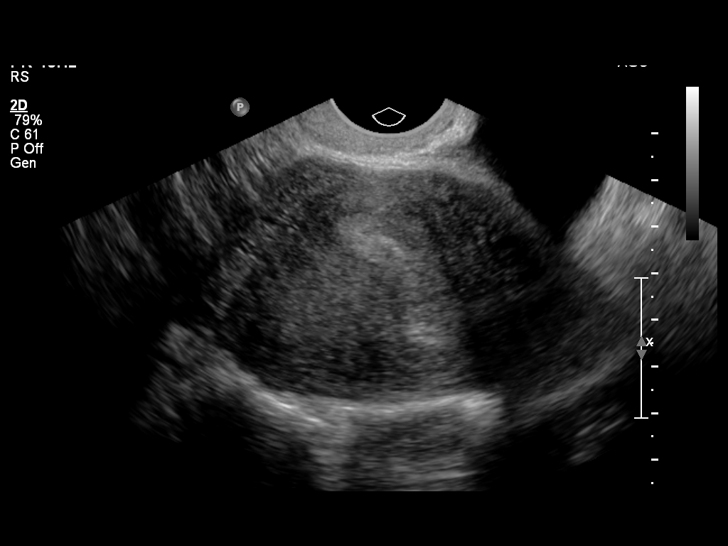
[im 42/54]
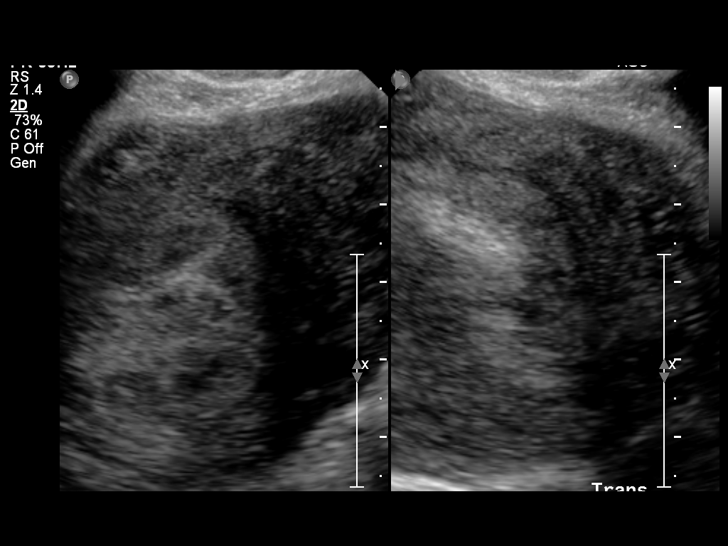
[im 46/54]
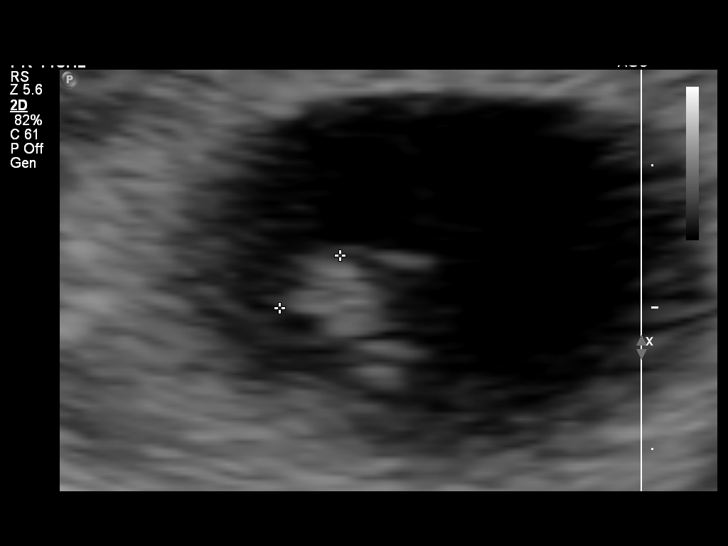
[im 50/54]
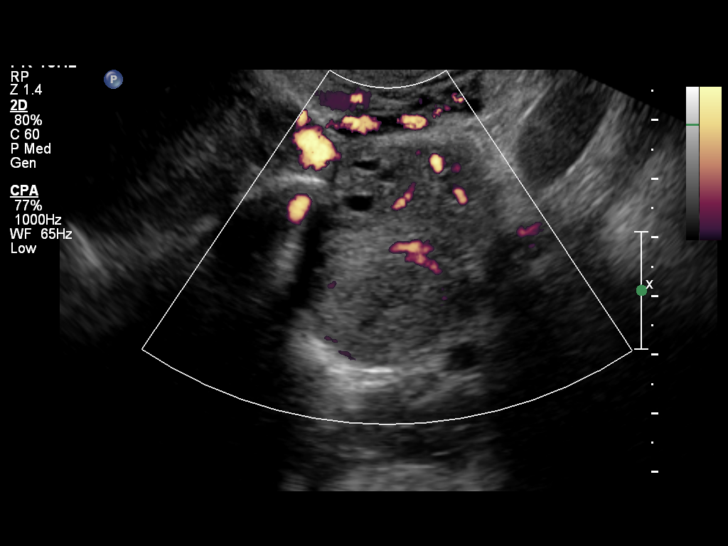
[im 54/54]
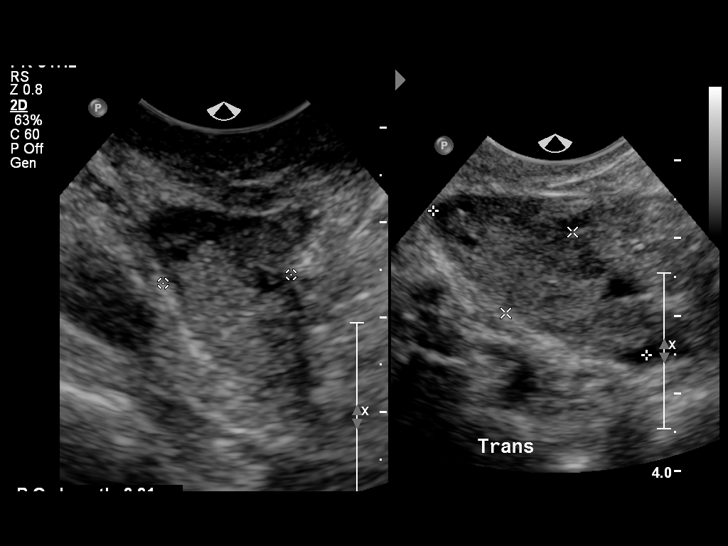

[14 of 28 positions shown; findings below may reference images not displayed]

FINDINGS: Intrauterine gestational sac: Visualized/normal in shape.

Yolk sac:  Visualized

Embryo:  Visualized

Cardiac Activity: Visualized

Heart Rate:  114 bpm

CRL:   3  mm  6 w 0 d                  US EDC: December 20, 2014

Maternal uterus/adnexae: There is a small subchorionic hemorrhage
measuring 1.1 x 0.6 cm. The cervical os is closed. Anteriorly, there
is a cesarian section scar.

Maternal ovaries appear normal in size and contour bilaterally. A
small corpus luteum measuring approximately 1 cm is noted in the
left ovary. No free pelvic fluid.
IMPRESSION: Single live intrauterine gestation with estimated gestational age of
6 weeks. Small subchorionic hemorrhage which will warrant close
clinical and imaging surveillance. Cesarian section scar anteriorly
noted.

## 2016-07-25 DIAGNOSIS — E049 Nontoxic goiter, unspecified: Secondary | ICD-10-CM | POA: Insufficient documentation

## 2018-09-17 DIAGNOSIS — R519 Headache, unspecified: Secondary | ICD-10-CM | POA: Insufficient documentation

## 2018-09-17 DIAGNOSIS — B070 Plantar wart: Secondary | ICD-10-CM | POA: Insufficient documentation

## 2018-09-17 DIAGNOSIS — M25519 Pain in unspecified shoulder: Secondary | ICD-10-CM | POA: Insufficient documentation

## 2018-12-06 ENCOUNTER — Other Ambulatory Visit: Payer: Self-pay

## 2018-12-06 ENCOUNTER — Encounter (HOSPITAL_COMMUNITY): Payer: Self-pay

## 2018-12-06 ENCOUNTER — Emergency Department (HOSPITAL_COMMUNITY)
Admission: EM | Admit: 2018-12-06 | Discharge: 2018-12-06 | Disposition: A | Discharge status: Home / Self Care | Payer: BLUE CROSS/BLUE SHIELD | Attending: Emergency Medicine | Admitting: Emergency Medicine

## 2018-12-06 DIAGNOSIS — Y999 Unspecified external cause status: Secondary | ICD-10-CM | POA: Diagnosis not present

## 2018-12-06 DIAGNOSIS — L03115 Cellulitis of right lower limb: Secondary | ICD-10-CM | POA: Insufficient documentation

## 2018-12-06 DIAGNOSIS — W57XXXA Bitten or stung by nonvenomous insect and other nonvenomous arthropods, initial encounter: Secondary | ICD-10-CM | POA: Diagnosis not present

## 2018-12-06 DIAGNOSIS — S70361A Insect bite (nonvenomous), right thigh, initial encounter: Secondary | ICD-10-CM | POA: Diagnosis present

## 2018-12-06 DIAGNOSIS — Z79899 Other long term (current) drug therapy: Secondary | ICD-10-CM | POA: Insufficient documentation

## 2018-12-06 DIAGNOSIS — Y939 Activity, unspecified: Secondary | ICD-10-CM | POA: Diagnosis not present

## 2018-12-06 DIAGNOSIS — Y929 Unspecified place or not applicable: Secondary | ICD-10-CM | POA: Insufficient documentation

## 2018-12-06 MED ORDER — PREDNISONE 20 MG PO TABS: 40.0000 mg | ORAL_TABLET | Freq: Every day | ORAL | 0 refills | Status: DC

## 2018-12-06 MED ORDER — DEXAMETHASONE SODIUM PHOSPHATE 10 MG/ML IJ SOLN
10.0000 mg | Freq: Once | INTRAMUSCULAR | Status: AC
  Administered 2018-12-06: 10 mg via INTRAMUSCULAR
  Filled 2018-12-06: qty 1

## 2018-12-06 NOTE — ED Provider Notes (Signed)
[place] EMERGENCY DEPARTMENT Provider Note   CSN: [id number] Arrival date & time: 12/06/18  1093    History   Chief Complaint Chief Complaint  Patient presents with  . Thigh swelling/ pain    HPI Audrey Vega is a 33 y.o. female.     HPI  Rash started 2 days ago - today is day 3 - started with sting on the right leg medial side of the leg, then became red and itchy - went to UC yesterday - given HC and Bactrim - neither worked.  Couldn't sleep last night - b/c of very itchy / redness, cold showers help - no airway issues. Redness continues to spread - very itchy and burning - she has no airwary issues.  Past Medical History:  Diagnosis Date  . Chronic salpingitis and oophoritis   . Depression   . Headache   . History of Hirschsprung's disease    INFANT --  S/P  SURGICAL REPAIR  . Intrauterine synechiae     Patient Active Problem List   Diagnosis Date Noted  . Indication for care in labor or delivery 12/17/2014  . Postpartum state 12/17/2014    Past Surgical History:  Procedure Laterality Date  . CESAREAN SECTION  2009  . COLON SURGERY  INFANT   HIRSCHSPRUNG'S DISEASE  . LAPAROSCOPY N/A 12/09/2013   Procedure: LAPAROSCOPY LYSIS OF ADHESIONS, right partial SALPINGECTOMY, left fimbrioplasty, enterolysis;  Surgeon: Governor Specking, MD;  Location: [facility];  Service: Gynecology;  Laterality: N/A;     OB History    Gravida  2   Para  2   Term  2   Preterm      AB      Living  1     SAB      TAB      Ectopic      Multiple  0   Live Births  1            Home Medications    Prior to Admission medications   Medication Sig Start Date End Date Taking? Authorizing Provider  HYDROcodone-acetaminophen (NORCO/VICODIN) 5-325 MG tablet Take 1-2 tablets by mouth every 4 (four) hours as needed for moderate pain. 08/03/15   Shawnee Knapp, MD  norgestimate-ethinyl estradiol (ORTHO-CYCLEN,SPRINTEC,PREVIFEM) 0.25-35  MG-MCG tablet Take 1 tablet by mouth daily.    [provider]  ondansetron (ZOFRAN-ODT) 8 MG disintegrating tablet Take 1 tablet (8 mg total) by mouth every 8 (eight) hours as needed for nausea. 08/03/15   Shawnee Knapp, MD  penicillin v potassium (VEETID) 500 MG tablet Take 1 tablet (500 mg total) by mouth 4 (four) times daily. 08/03/15   Shawnee Knapp, MD  predniSONE (DELTASONE) 20 MG tablet Take 2 tablets (40 mg total) by mouth daily. 12/06/18   Noemi Chapel, MD  Prenatal Vit-Fe Fumarate-FA (PRENATAL MULTIVITAMIN) TABS tablet Take 1 tablet by mouth daily at 12 noon. Reported on 08/03/2015    [provider]    Family History No family history on file.  Social History Social History   Tobacco Use  . Smoking status: Never Smoker  . Smokeless tobacco: Never Used  Substance Use Topics  . Alcohol use: Yes    Comment: OCCASIONAL  . Drug use: No     Allergies   Other and Promethazine   Review of Systems Review of Systems  All other systems reviewed and are negative.    Physical Exam Updated Vital Signs BP 111/75 (BP  Location: Right Arm)   Pulse 88   Temp 98.3 F (36.8 C) (Oral)   Resp 16   Ht 1.6 m (5\' 3" )   Wt 90.7 kg   SpO2 100%   BMI 35.43 kg/m   Physical Exam Vitals signs and nursing note reviewed.  Constitutional:      General: She is not in acute distress.    Appearance: She is well-developed.  HENT:     Head: Normocephalic and atraumatic.     Mouth/Throat:     Pharynx: No oropharyngeal exudate.  Eyes:     General: No scleral icterus.       Right eye: No discharge.        Left eye: No discharge.     Conjunctiva/sclera: Conjunctivae normal.     Pupils: Pupils are equal, round, and reactive to light.  Neck:     Musculoskeletal: Normal range of motion and neck supple.     Thyroid: No thyromegaly.     Vascular: No JVD.  Cardiovascular:     Rate and Rhythm: Normal rate and regular rhythm.     Heart sounds: Normal heart sounds. No murmur. No  friction rub. No gallop.   Pulmonary:     Effort: Pulmonary effort is normal. No respiratory distress.     Breath sounds: Normal breath sounds. No wheezing or rales.  Abdominal:     General: Bowel sounds are normal. There is no distension.     Palpations: Abdomen is soft. There is no mass.     Tenderness: There is no abdominal tenderness.  Musculoskeletal: Normal range of motion.        General: Tenderness present.  Lymphadenopathy:     Cervical: No cervical adenopathy.  Skin:    General: Skin is warm and dry.     Findings: Erythema and rash present.  Neurological:     Mental Status: She is alert.     Coordination: Coordination normal.  Psychiatric:        Behavior: Behavior normal.      ED Treatments / Results  Labs (all labs ordered are listed, but only abnormal results are displayed) Labs Reviewed - No data to display  EKG None  Radiology No results found.  Procedures Procedures (including critical care time)  Medications Ordered in ED Medications  dexamethasone (DECADRON) injection 10 mg (has no administration in time range)     Initial Impression / Assessment and Plan / ED Course  I have reviewed the triage vital signs and the nursing notes.  Pertinent labs & imaging results that were available during my care of the patient were reviewed by me and considered in my medical decision making (see chart for details).       The patient's exam is consistent with having what appears to be an envenomation from an insect.  The patient has no systemic symptoms, no airway issues, she is not tachycardic on my exam, she is normotensive and does not have a fever.  She only started the Bactrim last night however I suspect this is more related to the toxin and should get better over several days.  She will get a shot of Decadron as well as some prednisone for home to take in conjunction with the Bactrim which I agree with since she has a large area of redness.  That being said  it is not terribly consistent with cellulitis.  She is stable for discharge and understands the reasons for return.  Final Clinical Impressions(s) / ED Diagnoses  Final diagnoses:  Cellulitis of right lower extremity  Insect bite of right thigh, initial encounter    ED Discharge Orders         Ordered    predniSONE (DELTASONE) 20 MG tablet  Daily     12/06/18 0943           Eber HongMiller, Hieu Herms, MD 12/06/18 [phone]

## 2018-12-06 NOTE — ED Triage Notes (Signed)
Patient reports insect bite on Thursday to right thigh, seen at urgent care and started on antibiotics. Now increased swelling and pain to thigh

## 2018-12-06 NOTE — ED Notes (Signed)
Pt verbalized understanding of d/c instructions and has no further questions, VSS, NAD.  

## 2018-12-06 NOTE — Discharge Instructions (Signed)
Return for increasing pain swelling fevers or difficulty breathing.  Take prednisone 40 mg daily for 5 days  Take the antibiotic that you were given at the urgent care as prescribed until it is all gone.  You may also take Benadryl up to 25 to 50 mg every 6 hours as needed

## 2019-09-11 ENCOUNTER — Ambulatory Visit (INDEPENDENT_AMBULATORY_CARE_PROVIDER_SITE_OTHER): Payer: BLUE CROSS/BLUE SHIELD

## 2019-09-11 ENCOUNTER — Other Ambulatory Visit: Payer: Self-pay

## 2019-09-11 ENCOUNTER — Ambulatory Visit (INDEPENDENT_AMBULATORY_CARE_PROVIDER_SITE_OTHER): Payer: BLUE CROSS/BLUE SHIELD | Admitting: Podiatry

## 2019-09-11 ENCOUNTER — Other Ambulatory Visit: Payer: Self-pay | Admitting: Podiatry

## 2019-09-11 ENCOUNTER — Encounter: Payer: Self-pay | Admitting: Podiatry

## 2019-09-11 VITALS — Temp 97.2°F

## 2019-09-11 DIAGNOSIS — M2041 Other hammer toe(s) (acquired), right foot: Secondary | ICD-10-CM

## 2019-09-11 DIAGNOSIS — B070 Plantar wart: Secondary | ICD-10-CM | POA: Diagnosis not present

## 2019-09-11 DIAGNOSIS — M79671 Pain in right foot: Secondary | ICD-10-CM

## 2019-09-11 NOTE — Patient Instructions (Signed)

## 2019-10-09 ENCOUNTER — Ambulatory Visit (INDEPENDENT_AMBULATORY_CARE_PROVIDER_SITE_OTHER): Payer: BLUE CROSS/BLUE SHIELD | Admitting: Podiatry

## 2019-10-09 ENCOUNTER — Other Ambulatory Visit: Payer: Self-pay

## 2019-10-09 DIAGNOSIS — M2041 Other hammer toe(s) (acquired), right foot: Secondary | ICD-10-CM | POA: Diagnosis not present

## 2019-10-09 DIAGNOSIS — M722 Plantar fascial fibromatosis: Secondary | ICD-10-CM

## 2019-10-09 NOTE — Progress Notes (Signed)
  Subjective:  Patient ID: Audrey Vega, female    DOB: 03-24-1986,  MRN: [id number]  Chief Complaint  Patient presents with  . Skin Problem    R 4th toe, lateral side. x1 yr. Pt stated, "It started bothering me 1 month ago. It gets a black dot in it every now and then - I scrape it off".  . Foot Pain    R heel - back + medial side. Painful area - "feels like a bump". x2 months. Pt stated, "Feels like someone is ripping my foot apart if I step up on my toes - 20/10. I wear very uncomfortable shoes for work (steel-toe, waterproof)".    34 y.o. female presents with the above complaint. History confirmed with patient.   Objective:  Physical Exam: warm, good capillary refill, no trophic changes or ulcerative lesions, normal DP and PT pulses and normal sensory exam. Right Foot: petechial lesions 4th toe, heel   No images are attached to the encounter.  Radiographs: X-ray of the right foot: no fracture, dislocation, swelling or degenerative changes noted Assessment:   1. Verruca plantaris    Plan:  Patient was evaluated and treated and all questions answered.  Verruca, right foot -Does appear more like a corn 2/2 hammertoe but with a petechial appearance as well. -Educated on the etiology. -Lesion destroyed as below. -Educated on post-op care.  Procedure: Destruction of Lesion Location: right 4th toe, right heel Anesthesia: none Instrumentation: 15 blade. Technique: Debridement of lesion to petechial bleeding. Aperture pad applied around lesion. Small amount of canthrone applied to the base of the lesion. Dressing: Dry, sterile, compression dressing. Disposition: Patient tolerated procedure well. Advised to leave dressing on for 6-8 hours. Thereafter patient to wash the area with soap and water and applied band-aid. Off-loading pads dispensed. Patient to return in 2 weeks for follow-up.  No follow-ups on file.

## 2019-10-09 NOTE — Progress Notes (Signed)
  Subjective:  Patient ID: Audrey Vega, female    DOB: June 10, 1985,  MRN: [id number]  Chief Complaint  Patient presents with  . Plantar Warts    Right interdigital 4th painful lesion is still present "It is getting bigger". Pt states the spacer was not helpful and hurt.  . Foot Pain    Pt states bilateral heel pain still present, night splint does help.   34 y.o. female presents with the above complaint. History confirmed with patient.   Objective:  Physical Exam: warm, good capillary refill, no trophic changes or ulcerative lesions, normal DP and PT pulses and normal sensory exam. Left Foot: tenderness to palpation medial calcaneal tuber, no pain with calcaneal squeeze, decreased ankle joint ROM and +Silverskiold test Right Foot: tenderness to palpation medial calcaneal tuber, no pain with calcaneal squeeze, decreased ankle joint ROM and +Silverskiold test. R 4th toe hammertoe with PIPJ callus normal exam, no swelling, tenderness, instability; ligaments intact, full range of motion of all ankle/foot joints other than findings noted above. Assessment:   1. Plantar fasciitis   2. Hammertoe of right foot      Plan:  Patient was evaluated and treated and all questions answered.  R 4th Toe Hammertoe -Lesion debrided again -Consider surgical correction if it persists.  Plantar Fasciitis -Injection delivered to the plantar fascia of both feet.  Procedure: Injection Tendon/Ligament Consent: Verbal consent obtained. Location: Bilateral plantar fascia at the glabrous junction; medial approach. Skin Prep: Alcohol. Injectate: 1 cc 0.5% marcaine plain, 1 cc dexamethasone phosphate, 0.5 cc kenalog 10. Disposition: Patient tolerated procedure well. Injection site dressed with a band-aid.    Return in about 1 month (around 11/08/2019) for Plantar fasciitis.

## 2019-11-12 ENCOUNTER — Ambulatory Visit (INDEPENDENT_AMBULATORY_CARE_PROVIDER_SITE_OTHER): Payer: BLUE CROSS/BLUE SHIELD | Admitting: Podiatry

## 2019-11-12 ENCOUNTER — Other Ambulatory Visit: Payer: Self-pay

## 2019-11-12 DIAGNOSIS — M722 Plantar fascial fibromatosis: Secondary | ICD-10-CM

## 2019-11-12 DIAGNOSIS — M2041 Other hammer toe(s) (acquired), right foot: Secondary | ICD-10-CM

## 2019-11-12 NOTE — Progress Notes (Signed)
  Subjective:  Patient ID: Audrey Vega, female    DOB: October 23, 1985,  MRN: [id number]  Chief Complaint  Patient presents with  . Plantar Warts    Right 4th interdigital wart follow up. Pt states it is still painful.   34 y.o. female presents with the above complaint. History confirmed with patient. States the injection helped the right heel but left still having pain. Toe pain is worse and is even having pain when not on it and at night.  Objective:  Physical Exam: warm, good capillary refill, no trophic changes or ulcerative lesions, normal DP and PT pulses and normal sensory exam. Left Foot: tenderness to palpation medial calcaneal tuber, no pain with calcaneal squeeze, decreased ankle joint ROM and +Silverskiold test Right Foot: tenderness to palpation medial calcaneal tuber, no pain with calcaneal squeeze, decreased ankle joint ROM and +Silverskiold test. R 4th toe hammertoe with PIPJ callus normal exam, no swelling, tenderness, instability; ligaments intact, full range of motion of all ankle/foot joints other than findings noted above. Assessment:   1. Plantar fasciitis   2. Hammertoe of right foot     Plan:  Patient was evaluated and treated and all questions answered.  R 4th Toe Hammertoe -Lesion debrided again, courtesy -Patient has failed all conservative therapy and wishes to proceed with surgical intervention. All risks, benefits, and alternatives discussed with patient. No guarantees given. Consent reviewed and signed by patient. -Planned procedures: Right 4th toe hammertoe correction, injection of plantar fascia bilat  Plantar Fasciitis -Defer injection today -Dispense plantar fascial brace left -Plan for injection at time of surgery of above   No follow-ups on file.

## 2019-11-17 ENCOUNTER — Telehealth: Payer: Self-pay

## 2019-11-17 NOTE — Telephone Encounter (Signed)
DOS 11/25/19  HAMMERTOE REPAIR 4TH RT - 35701 INJECTION B/L - 20551  [employer] EFFECTIVE DATE - 05/08/2019  PLAN DEDUCTIBLE - $2000.00 W/ $1903.00 REMAINING OUT OF POCKET - $4000.00 W/ $3903.00 REMAINING COPAY $0.00 COINSURANCE - 20%  PER [place] AT [employer] NO PRECERT REQUIRED FOR THIS POLICY. CALL REF # [id number]

## 2019-11-25 ENCOUNTER — Encounter: Payer: Self-pay | Admitting: Podiatry

## 2019-11-25 ENCOUNTER — Other Ambulatory Visit: Payer: Self-pay | Admitting: Podiatry

## 2019-11-25 DIAGNOSIS — M2041 Other hammer toe(s) (acquired), right foot: Secondary | ICD-10-CM

## 2019-11-25 DIAGNOSIS — M722 Plantar fascial fibromatosis: Secondary | ICD-10-CM

## 2019-11-25 MED ORDER — OXYCODONE-ACETAMINOPHEN 5-325 MG PO TABS: 1.0000 | ORAL_TABLET | ORAL | 0 refills | Status: DC | PRN

## 2019-11-25 MED ORDER — ONDANSETRON HCL 4 MG PO TABS: 4.0000 mg | ORAL_TABLET | Freq: Three times a day (TID) | ORAL | 0 refills | Status: DC | PRN

## 2019-11-25 MED ORDER — CEPHALEXIN 500 MG PO CAPS: 500.0000 mg | ORAL_CAPSULE | Freq: Two times a day (BID) | ORAL | 0 refills | Status: DC

## 2019-11-25 NOTE — Progress Notes (Signed)
Rx sent to pharmacy for outpatient surgery. °

## 2019-11-26 ENCOUNTER — Telehealth: Payer: Self-pay

## 2019-11-26 NOTE — Telephone Encounter (Signed)
POST OP CALL-    1) General condition stated by the patient:  Pt states that she is having a pounding pain to the foot 2) Is the pt having pain?  Yes 3) Pain score:  9 out of 10 4) Has the pt taken Rx'd pain medication, regularly or PRN?  Pt is taking pain meds prn   5) Is the pain medication giving relief? Yes 6) Any fever, chills, nausea, or vomiting, shortness of breath or tightness in calf? No 7) Is the bandage clean, dry and intact? Yes. But pt's bandage is dry 8) Is there excessive tightness, bleeding or drainage coming through the bandage? No 9) Did you understand all of the post op instruction sheet given? No 10) Any questions or concerns regarding post op care/recovery? No   Confirmed POV appointment with patient

## 2019-11-27 DIAGNOSIS — M79676 Pain in unspecified toe(s): Secondary | ICD-10-CM

## 2019-12-04 ENCOUNTER — Other Ambulatory Visit: Payer: Self-pay

## 2019-12-04 ENCOUNTER — Ambulatory Visit (INDEPENDENT_AMBULATORY_CARE_PROVIDER_SITE_OTHER): Payer: BLUE CROSS/BLUE SHIELD

## 2019-12-04 ENCOUNTER — Ambulatory Visit (INDEPENDENT_AMBULATORY_CARE_PROVIDER_SITE_OTHER): Payer: BLUE CROSS/BLUE SHIELD | Admitting: Podiatry

## 2019-12-04 ENCOUNTER — Encounter: Payer: Self-pay | Admitting: Podiatry

## 2019-12-04 ENCOUNTER — Encounter: Payer: BLUE CROSS/BLUE SHIELD | Admitting: Podiatry

## 2019-12-04 DIAGNOSIS — M2041 Other hammer toe(s) (acquired), right foot: Secondary | ICD-10-CM | POA: Diagnosis not present

## 2019-12-04 NOTE — Progress Notes (Signed)
  Subjective:  Patient ID: Audrey Vega, female    DOB: 12/19/1985,  MRN: [id number]  Chief Complaint  Patient presents with  . Routine Post Op    POV#1 DOS 7.21.2021 RT 4TH Hammertoe correction, injection of both heels for plantar fasc. Pt states she dislikes the bandages and wants to know if she still needs them. Denies fever/chills/nausea/vomiting.    DOS: 11/25/19 Procedure: Right 4th hammertoe correction, injection  34 y.o. female presents with the above complaint. History confirmed with patient.   Objective:  Physical Exam: tenderness at the surgical site, local edema noted and calf supple, nontender. Incision: healing well, no significant drainage, no dehiscence  No images are attached to the encounter.  Radiographs: X-ray of the right foot: consistent with post-op state   Assessment:   1. Hammertoe of right foot     Plan:  Patient was evaluated and treated and all questions answered.  Post-operative State -XR reviewed with patient -Dressing applied consisting of sterile gauze, kerlix and ACE bandage  -WBAT in surgical shoe. -F/u in 2 weeks for suture removal  Return in about 2 weeks (around 12/18/2019) for Post-Op (No XRs).

## 2019-12-15 ENCOUNTER — Ambulatory Visit (INDEPENDENT_AMBULATORY_CARE_PROVIDER_SITE_OTHER): Payer: BLUE CROSS/BLUE SHIELD | Admitting: Podiatry

## 2019-12-15 ENCOUNTER — Other Ambulatory Visit: Payer: Self-pay

## 2019-12-15 DIAGNOSIS — M2041 Other hammer toe(s) (acquired), right foot: Secondary | ICD-10-CM

## 2019-12-15 DIAGNOSIS — Z9889 Other specified postprocedural states: Secondary | ICD-10-CM

## 2019-12-15 DIAGNOSIS — M722 Plantar fascial fibromatosis: Secondary | ICD-10-CM

## 2019-12-15 NOTE — Progress Notes (Signed)
  Subjective:  Patient ID: Audrey Vega, female    DOB: 08/23/1985,  MRN: [id number]  No chief complaint on file.  DOS: 11/25/19 Procedure: Right 4th hammertoe correction, injection  34 y.o. female presents for post-op appt. States the bandages annoy her foot. Has some numbness to her big toe from the bandages rubbing. Has some pain with pressure applied to the toe.  Objective:  Physical Exam: tenderness at the surgical site, local edema noted and calf supple, nontender. Incision: healing well, no significant drainage, no dehiscence  Assessment:   1. Hammertoe of right foot   2. Plantar fasciitis   3. Post-operative state     Plan:  Patient was evaluated and treated and all questions answered.  Post-operative State -Sutures removed. Steris applied. Ok to shower but not soak -Continue surgical shoe for 2 more weeks  Plantar Fasciitis -Heel pad applied to shoe.  Return in about 4 weeks (around 01/12/2020) for Post-Op (No XRs).

## 2019-12-29 ENCOUNTER — Encounter: Payer: BLUE CROSS/BLUE SHIELD | Admitting: Podiatry

## 2020-01-05 ENCOUNTER — Other Ambulatory Visit: Payer: Self-pay

## 2020-01-05 ENCOUNTER — Ambulatory Visit (INDEPENDENT_AMBULATORY_CARE_PROVIDER_SITE_OTHER): Payer: BLUE CROSS/BLUE SHIELD | Admitting: Podiatry

## 2020-01-05 DIAGNOSIS — M2041 Other hammer toe(s) (acquired), right foot: Secondary | ICD-10-CM

## 2020-01-05 DIAGNOSIS — Z9889 Other specified postprocedural states: Secondary | ICD-10-CM

## 2020-01-05 DIAGNOSIS — M722 Plantar fascial fibromatosis: Secondary | ICD-10-CM

## 2020-01-05 NOTE — Progress Notes (Signed)
  Subjective:  Patient ID: Audrey Vega, female    DOB: 16-Apr-1986,  MRN: [id number]  Chief Complaint  Patient presents with  . Routine Post Op    4 wk Hammertoe Correction. Right 4th toe, 3/10 pain on scale. More like pressure on the toe. Sight redness, little swollen.    DOS: 11/25/19 Procedure: Right 4th hammertoe correction, injection  34 y.o. female presents for post-op appt. Hx above confirmed with patient.  Objective:  Physical Exam: Slight edema 4th toe no POP. No pain on attempted ROM Incision: healing well, no significant drainage, no dehiscence  Assessment:   1. Hammertoe of right foot   2. Plantar fasciitis   3. Post-operative state     Plan:  Patient was evaluated and treated and all questions answered.  Post-operative State -Well healed, some swelling no longer with pain -Coban applied to the toe -Ok to return to work without restrictions  Plantar Fasciitis -Resolving, does not want other injections  Return in about 6 weeks (around 02/16/2020).

## 2020-01-06 ENCOUNTER — Encounter: Payer: Self-pay | Admitting: Podiatry

## 2020-02-19 ENCOUNTER — Ambulatory Visit (INDEPENDENT_AMBULATORY_CARE_PROVIDER_SITE_OTHER): Payer: BLUE CROSS/BLUE SHIELD | Admitting: Podiatry

## 2020-02-19 ENCOUNTER — Other Ambulatory Visit: Payer: Self-pay

## 2020-02-19 DIAGNOSIS — M2041 Other hammer toe(s) (acquired), right foot: Secondary | ICD-10-CM

## 2020-02-19 DIAGNOSIS — M722 Plantar fascial fibromatosis: Secondary | ICD-10-CM

## 2020-02-19 MED ORDER — METHYLPREDNISOLONE 4 MG PO TBPK: ORAL_TABLET | ORAL | 0 refills | Status: DC

## 2020-03-03 ENCOUNTER — Other Ambulatory Visit: Payer: Self-pay

## 2020-03-03 ENCOUNTER — Ambulatory Visit (INDEPENDENT_AMBULATORY_CARE_PROVIDER_SITE_OTHER): Payer: BLUE CROSS/BLUE SHIELD | Admitting: Orthotics

## 2020-03-03 DIAGNOSIS — M2041 Other hammer toe(s) (acquired), right foot: Secondary | ICD-10-CM | POA: Diagnosis not present

## 2020-03-03 DIAGNOSIS — M722 Plantar fascial fibromatosis: Secondary | ICD-10-CM | POA: Diagnosis not present

## 2020-03-03 NOTE — Progress Notes (Signed)

## 2020-03-07 NOTE — Progress Notes (Signed)
  Subjective:  Patient ID: Audrey Vega, female    DOB: 23-Jun-1985,  MRN: [id number]  No chief complaint on file.  DOS: 11/25/19 Procedure: Right 4th hammertoe correction, injection  34 y.o. female presents for post-op appt. Hx above confirmed with patient.  Objective:  Physical Exam: Slight edema 4th toe no POP. No pain on attempted ROM Incision: healing well, no significant drainage, no dehiscence  Assessment:   1. Hammertoe of right foot   2. Plantar fasciitis     Plan:  Patient was evaluated and treated and all questions answered.  Post-operative State -Well healed, only still having some swelling. -Rx medrol pack -Will discharge today and f/u PRN  Plantar Fasciitis -Resolving, does not want other injections -Will benefit from orthotics. Will make appt.  Return in about 6 weeks (around 04/01/2020).

## 2020-04-04 ENCOUNTER — Ambulatory Visit: Payer: BLUE CROSS/BLUE SHIELD | Admitting: Orthotics

## 2020-04-04 ENCOUNTER — Other Ambulatory Visit: Payer: Self-pay

## 2020-04-04 DIAGNOSIS — M2041 Other hammer toe(s) (acquired), right foot: Secondary | ICD-10-CM

## 2020-04-04 DIAGNOSIS — Z9889 Other specified postprocedural states: Secondary | ICD-10-CM

## 2020-04-04 DIAGNOSIS — M722 Plantar fascial fibromatosis: Secondary | ICD-10-CM

## 2020-04-04 NOTE — Progress Notes (Signed)
Patient picked up f/o and was pleased with fit, comfort, and function.  Worked well with footwear.  Told of rbeak in period and how to report any issues.  

## 2020-04-05 ENCOUNTER — Other Ambulatory Visit: Payer: Self-pay | Admitting: Podiatry

## 2020-04-05 ENCOUNTER — Ambulatory Visit (INDEPENDENT_AMBULATORY_CARE_PROVIDER_SITE_OTHER): Payer: BLUE CROSS/BLUE SHIELD

## 2020-04-05 ENCOUNTER — Ambulatory Visit (INDEPENDENT_AMBULATORY_CARE_PROVIDER_SITE_OTHER): Payer: BLUE CROSS/BLUE SHIELD | Admitting: Podiatry

## 2020-04-05 DIAGNOSIS — M2041 Other hammer toe(s) (acquired), right foot: Secondary | ICD-10-CM

## 2020-04-05 DIAGNOSIS — M722 Plantar fascial fibromatosis: Secondary | ICD-10-CM | POA: Diagnosis not present

## 2020-04-05 DIAGNOSIS — Z8739 Personal history of other diseases of the musculoskeletal system and connective tissue: Secondary | ICD-10-CM

## 2020-04-05 DIAGNOSIS — Z9889 Other specified postprocedural states: Secondary | ICD-10-CM

## 2020-04-05 MED ORDER — METHYLPREDNISOLONE 4 MG PO TBPK: ORAL_TABLET | ORAL | 0 refills | Status: DC

## 2020-04-05 NOTE — Progress Notes (Signed)
  Subjective:  Patient ID: Audrey Vega, female    DOB: 05-12-1985,  MRN: [id number]  Chief Complaint  Patient presents with  . Routine Post Op    6 WEEK POST OP. PT STATES SHE FEELS OKAY MINIMAL PAIN    DOS: 11/25/19 Procedure: Right 4th hammertoe correction, injection  34 y.o. female presents for post-op appt. Hx above confirmed with patient.  Objective:  Physical Exam: Slight edema 4th toe no POP. No pain on attempted ROM. POP left heel medial calc tuber Incision: healing well, no significant drainage, no dehiscence  Assessment:   1. Status post hammertoe correction     Plan:  Patient was evaluated and treated and all questions answered.  Post-operative State -Well healed, small residual swelling.  Plantar Fasciitis -Worsened. Repeat steroid pack  No follow-ups on file.

## 2021-06-29 ENCOUNTER — Other Ambulatory Visit: Payer: Self-pay

## 2021-06-29 ENCOUNTER — Encounter: Payer: Self-pay | Admitting: Emergency Medicine

## 2021-06-29 ENCOUNTER — Ambulatory Visit
Admission: EM | Admit: 2021-06-29 | Discharge: 2021-06-29 | Disposition: A | Discharge status: Home / Self Care | Payer: BLUE CROSS/BLUE SHIELD | Attending: Urgent Care | Admitting: Urgent Care

## 2021-06-29 DIAGNOSIS — K0889 Other specified disorders of teeth and supporting structures: Secondary | ICD-10-CM | POA: Diagnosis not present

## 2021-06-29 DIAGNOSIS — J019 Acute sinusitis, unspecified: Secondary | ICD-10-CM

## 2021-06-29 DIAGNOSIS — J3489 Other specified disorders of nose and nasal sinuses: Secondary | ICD-10-CM

## 2021-06-29 DIAGNOSIS — R058 Other specified cough: Secondary | ICD-10-CM

## 2021-06-29 MED ORDER — AMOXICILLIN-POT CLAVULANATE 875-125 MG PO TABS: 1.0000 | ORAL_TABLET | Freq: Two times a day (BID) | ORAL | 0 refills | Status: DC

## 2021-06-29 MED ORDER — LEVOCETIRIZINE DIHYDROCHLORIDE 5 MG PO TABS: 5.0000 mg | ORAL_TABLET | Freq: Every evening | ORAL | 0 refills | Status: DC

## 2021-06-29 MED ORDER — BENZONATATE 100 MG PO CAPS: 100.0000 mg | ORAL_CAPSULE | Freq: Three times a day (TID) | ORAL | 0 refills | Status: DC | PRN

## 2021-06-29 NOTE — ED Provider Notes (Signed)
[facility]   MRN: [id number] DOB: 12-Jul-1985  Subjective:   Audrey Vega is a 36 y.o. female presenting for 3-week history of persistent worsening sinus pressure, sinus congestion, bilateral ear fullness and popping, productive cough, hoarseness, throat pain.  Has also had right-sided facial pain this week with dental pain.  Would like her teeth to be evaluated.  No chest pain, shortness of breath or wheezing.  No current facility-administered medications for this encounter.  Current Outpatient Medications:    cephALEXin (KEFLEX) 500 MG capsule, Take 1 capsule (500 mg total) by mouth 2 (two) times daily. (Patient not taking: Reported on 01/05/2020), Disp: 14 capsule, Rfl: 0   hydrOXYzine (ATARAX/VISTARIL) 25 MG tablet, Take by mouth. (Patient not taking: Reported on 01/05/2020), Disp: , Rfl:    methylPREDNISolone (MEDROL DOSEPAK) 4 MG TBPK tablet, 6 Day Taper Pack. Take as Directed., Disp: 21 tablet, Rfl: 0   norgestimate-ethinyl estradiol (ORTHO-CYCLEN,SPRINTEC,PREVIFEM) 0.25-35 MG-MCG tablet, Take 1 tablet by mouth daily., Disp: , Rfl:    ondansetron (ZOFRAN) 4 MG tablet, Take 1 tablet (4 mg total) by mouth every 8 (eight) hours as needed for nausea or vomiting. (Patient not taking: Reported on 01/05/2020), Disp: 20 tablet, Rfl: 0   oxyCODONE-acetaminophen (PERCOCET) 5-325 MG tablet, Take 1 tablet by mouth every 4 (four) hours as needed for severe pain. (Patient not taking: Reported on 01/05/2020), Disp: 20 tablet, Rfl: 0   Prenatal Vit-Fe Fumarate-FA (PRENATAL MULTIVITAMIN) TABS tablet, Take 1 tablet by mouth daily at 12 noon. Reported on 08/03/2015, Disp: , Rfl:    Allergies  Allergen Reactions   Other Itching    Vick's vapor rub   Promethazine Other (See Comments)    Tardive dyskinesia    Past Medical History:  Diagnosis Date   Chronic salpingitis and oophoritis    Depression    Headache    History of Hirschsprung's disease    INFANT --  S/P  SURGICAL REPAIR    Intrauterine synechiae      Past Surgical History:  Procedure Laterality Date   CESAREAN SECTION  2009   COLON SURGERY  INFANT   HIRSCHSPRUNG'S DISEASE   LAPAROSCOPY N/A 12/09/2013   Procedure: LAPAROSCOPY LYSIS OF ADHESIONS, right partial SALPINGECTOMY, left fimbrioplasty, enterolysis;  Surgeon: Governor Specking, MD;  Location: [facility] [place];  Service: Gynecology;  Laterality: N/A;    History reviewed. No pertinent family history.  Social History   Tobacco Use   Smoking status: Never   Smokeless tobacco: Never  Substance Use Topics   Alcohol use: Yes    Comment: OCCASIONAL   Drug use: No    ROS   Objective:   Vitals: BP 135/78 (BP Location: Right Arm)    Pulse (!) 105    Temp 98.8 F (37.1 C) (Oral)    Resp 18    SpO2 99%   Physical Exam Constitutional:      General: She is not in acute distress.    Appearance: Normal appearance. She is well-developed and normal weight. She is not ill-appearing, toxic-appearing or diaphoretic.  HENT:     Head: Normocephalic and atraumatic.     Right Ear: Ear canal and external ear normal. No drainage or tenderness. No middle ear effusion. There is no impacted cerumen. Tympanic membrane is not erythematous.     Left Ear: Ear canal and external ear normal. No drainage or tenderness.  No middle ear effusion. There is no impacted cerumen. Tympanic membrane is not erythematous.     Ears:  Comments: TMs opacified bilaterally but intact and without signs of bacterial infection.    Nose: Congestion and rhinorrhea present.     Comments: Nasal mucosa erythematous.    Mouth/Throat:     Mouth: Mucous membranes are moist. No oral lesions.     Pharynx: No pharyngeal swelling, oropharyngeal exudate, posterior oropharyngeal erythema or uvula swelling.     Tonsils: No tonsillar exudate or tonsillar abscesses.      Comments: Significant postnasal drainage. Eyes:     General: No scleral icterus.       Right eye: No discharge.         Left eye: No discharge.     Extraocular Movements: Extraocular movements intact.     Right eye: Normal extraocular motion.     Left eye: Normal extraocular motion.     Conjunctiva/sclera: Conjunctivae normal.  Cardiovascular:     Rate and Rhythm: Normal rate.     Heart sounds: No murmur heard.   No friction rub. No gallop.  Pulmonary:     Effort: Pulmonary effort is normal. No respiratory distress.     Breath sounds: No stridor. No wheezing, rhonchi or rales.  Chest:     Chest wall: No tenderness.  Musculoskeletal:     Cervical back: Normal range of motion and neck supple.  Lymphadenopathy:     Cervical: No cervical adenopathy.  Skin:    General: Skin is warm and dry.  Neurological:     General: No focal deficit present.     Mental Status: She is alert and oriented to person, place, and time.  Psychiatric:        Mood and Affect: Mood normal.        Behavior: Behavior normal.    Assessment and Plan :   PDMP not reviewed this encounter.  1. Acute non-recurrent sinusitis, unspecified location   2. Sinus pain   3. Sinus pressure   4. Productive cough   5. Pain, dental    Will start empiric treatment for sinusitis with Augmentin.  Recommended supportive care otherwise including the use of oral antihistamine.  Advised that she contact dental practice for consultation regarding her problematic molar of the right lower side.  Suspect the tachycardia is related to her pain as opposed to an acute cardiopulmonary event and therefore will defer an EKG, recommend close follow-up with her PCP.  Counseled patient on potential for adverse effects with medications prescribed/recommended today, ER and return-to-clinic precautions discussed, patient verbalized understanding.    Jaynee Eagles, PA-C 06/29/21 1012

## 2021-06-29 NOTE — ED Triage Notes (Signed)
Nasal congestion and pressure and buzzing in ears x 3 weeks. Productive cough at times with green sputum.

## 2021-06-29 NOTE — Discharge Instructions (Signed)
[facility] °Emergency dental service in [place], [place] °Address: [address], [place], [place] [address] °Phone: [phone] ° °[facility] °[phone] extension [phone] °[address] ° °[place] [place] °[phone] °[address] ° °[employer] [employer] °[phone] °[address] ° °[facility] °[phone] extension [phone] °[address] [place], [place], [address] °First come first serve for the first 10 clients.  May do simple extractions only, no wisdom teeth or surgery.  You may try the second for Thursday of the month starting at 6:30 AM. ° °[employer] °You may call the school to see if they are still helping to provide dental care for emergent cases. ° °

## 2021-07-27 ENCOUNTER — Other Ambulatory Visit: Payer: Self-pay

## 2021-07-27 ENCOUNTER — Ambulatory Visit (INDEPENDENT_AMBULATORY_CARE_PROVIDER_SITE_OTHER): Payer: BLUE CROSS/BLUE SHIELD | Admitting: Nurse Practitioner

## 2021-07-27 ENCOUNTER — Encounter: Payer: Self-pay | Admitting: Nurse Practitioner

## 2021-07-27 VITALS — BP 104/72 | HR 76 | Temp 97.9°F | Ht 63.5 in | Wt 200.2 lb

## 2021-07-27 DIAGNOSIS — B009 Herpesviral infection, unspecified: Secondary | ICD-10-CM | POA: Diagnosis not present

## 2021-07-27 DIAGNOSIS — E049 Nontoxic goiter, unspecified: Secondary | ICD-10-CM | POA: Diagnosis not present

## 2021-07-27 DIAGNOSIS — Z1159 Encounter for screening for other viral diseases: Secondary | ICD-10-CM

## 2021-07-27 DIAGNOSIS — Z0000 Encounter for general adult medical examination without abnormal findings: Secondary | ICD-10-CM | POA: Diagnosis not present

## 2021-07-27 DIAGNOSIS — Z131 Encounter for screening for diabetes mellitus: Secondary | ICD-10-CM

## 2021-07-27 DIAGNOSIS — Z7689 Persons encountering health services in other specified circumstances: Secondary | ICD-10-CM | POA: Diagnosis not present

## 2021-07-27 DIAGNOSIS — Z13220 Encounter for screening for lipoid disorders: Secondary | ICD-10-CM

## 2021-07-27 MED ORDER — VALACYCLOVIR HCL 1 G PO TABS: 2000.0000 mg | ORAL_TABLET | Freq: Two times a day (BID) | ORAL | 0 refills | Status: AC

## 2021-07-27 NOTE — Progress Notes (Signed)
? ?Subjective:  ? ? Patient ID: Audrey Vega, female    DOB: 03/27/1986, 36 y.o.   MRN: [id number] ? ?HPI ? ?Pt here to establish care/physical.  ? ?The patient comes in today for a wellness visit. ? ?A review of their health history was completed. ? A review of medications was also completed. ? ?Any needed refills; none ? ?Eating habits: Fair ? ?Falls/  MVA accidents in past few months: none ? ?Regular exercise: Run on the treadmill at least 30-45 mins 3 times a wek orr do wall pilates.  ? ?Specialist pt sees on regular basis: no ? ?Preventative health issues were discussed.  ? ?Additional concerns:  ?  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 Has been using Abreva but it does not seem to be helping.  Patient states that she gets cold sore outbreaks 4-5 times a year. ? ? ?Review of Systems  ?Skin:   ?     Cold sores  ?All other systems reviewed and are negative. ? ?   ?Objective:  ? Physical Exam ?Vitals reviewed.  ?Constitutional:   ?   General: She is not in acute distress. ?   Appearance: Normal appearance. She is obese. She is not ill-appearing, toxic-appearing or diaphoretic.  ?HENT:  ?   Mouth/Throat:  ?   Comments: Small healing cold sore noted to upper right lip ?Neck:  ?   Thyroid: Thyromegaly present.  ?   Vascular: No carotid bruit.  ?   Comments: Thyromegaly noted ?Cardiovascular:  ?   Rate and Rhythm: Normal rate and regular rhythm.  ?   Pulses: Normal pulses.  ?   Heart sounds: Normal heart sounds. No murmur heard. ?Pulmonary:  ?   Effort: Pulmonary effort is normal. No respiratory distress.  ?   Breath sounds: Normal breath sounds. No wheezing.  ?Abdominal:  ?   General: Abdomen is flat. Bowel sounds are normal. There is no distension.  ?   Palpations: Abdomen is soft. There is no mass.  ?   Tenderness: There is no abdominal tenderness. There is no guarding or rebound.  ?   Hernia: No hernia is present.  ?Musculoskeletal:  ?   Cervical back: Normal range of motion  and neck supple. No rigidity or tenderness.  ?   Comments: Grossly intact  ?Lymphadenopathy:  ?   Cervical: No cervical adenopathy.  ?Skin: ?   General: Skin is warm.  ?   Capillary Refill: Capillary refill takes less than 2 seconds.  ?Neurological:  ?   Mental Status: She is alert.  ?   Comments: Grossly intact  ?Psychiatric:     ?   Mood and Affect: Mood normal.     ?   Behavior: Behavior normal.  ? ? ?   ?Assessment & Plan:  ?1. Wellness examination ?Adult wellness-complete.wellness physical was conducted today. Importance of diet and exercise were discussed in detail.  ?In addition to this a discussion regarding safety was also covered. We also reviewed over immunizations and gave recommendations regarding current immunization needed for age.  ?In addition to this additional areas were also touched on including: ?Preventative health exams needed: ? ?Colonoscopy not indicated ?Mammogram not indicated ?Bone density not indicated ?Referral placed for eye exam ?HIV testing up-to-date ?Hepatitis C testing to be completed today ?Pap smear overdue but is scheduled in June with OB/GYN ?Flu vaccine declined ?COVID-vaccine declined ? ?Patient was advised yearly wellness exam  ? ?- Ambulatory referral to Optometry ?-  Lipid Profile ?- HgB A1c ?- CBC with Differential/Platelet ?- CMP14+EGFR ?- TSH + free T4 ?- Hepatitis C Antibody ? ?3. Goiter ?- Goiter noted approximately 2 years ago no imaging or TSH noted for review.   ?-Patient denies any changes to weight border likely benign but will seek out imaging for confirmation. ?-We will obtain thyroid ultrasound and TSH ?- CBC with Differential/Platelet ?- CMP14+EGFR ?- TSH + free T4 ?-Return to clinic in 6 months or sooner if needed ? ?4. HSV-1 infection ?-Prescribed Valtrex to help manage patient's recurrent HSV-1 outbreaks. ?-Patient to use Valtrex within 72 hours of onset of a new outbreak to 2000 mg twice daily x1 day. ?- valACYclovir (VALTREX) 1000 MG tablet; Take 2  tablets (2,000 mg total) by mouth 2 (two) times daily. Take 2 tablets twice a day at the first sign of an out break for ONE day.  Dispense: 20 tablet; Refill: 0 ?-Return to clinic in 6 months or sooner if needed for evaluation ? ?5. Diabetes mellitus screening ?-No previous A1c to review.  We will get A1c today. ?- HgB A1c ? ?6. Lipid screening ?No previous lipid profile to review.  We will get lipid today ?- Lipid Profile ? ?7. Encounter for hepatitis C screening test for low risk patient ?- Hepatitis C Antibody ? ?  ?Note:  This document was prepared using Dragon voice recognition software and may include unintentional dictation errors. ? ? ?

## 2021-07-28 LAB — CBC WITH DIFFERENTIAL/PLATELET
Basophils Absolute: 0 10*3/uL (ref 0.0–0.2)
Basos: 1 %
EOS (ABSOLUTE): 0.1 10*3/uL (ref 0.0–0.4)
Eos: 1 %
Hematocrit: 40.3 % (ref 34.0–46.6)
Hemoglobin: 13.5 g/dL (ref 11.1–15.9)
Immature Grans (Abs): 0 10*3/uL (ref 0.0–0.1)
Immature Granulocytes: 0 %
Lymphocytes Absolute: 1.5 10*3/uL (ref 0.7–3.1)
Lymphs: 31 %
MCH: 29.8 pg (ref 26.6–33.0)
MCHC: 33.5 g/dL (ref 31.5–35.7)
MCV: 89 fL (ref 79–97)
Monocytes Absolute: 0.4 10*3/uL (ref 0.1–0.9)
Monocytes: 8 %
Neutrophils Absolute: 2.9 10*3/uL (ref 1.4–7.0)
Neutrophils: 59 %
Platelets: 263 10*3/uL (ref 150–450)
RBC: 4.53 x10E6/uL (ref 3.77–5.28)
RDW: 12 % (ref 11.7–15.4)
WBC: 4.8 10*3/uL (ref 3.4–10.8)

## 2021-07-28 LAB — CMP14+EGFR
ALT: 22 IU/L (ref 0–32)
AST: 19 IU/L (ref 0–40)
Albumin/Globulin Ratio: 1.7 (ref 1.2–2.2)
Albumin: 4.3 g/dL (ref 3.8–4.8)
Alkaline Phosphatase: 65 IU/L (ref 44–121)
BUN/Creatinine Ratio: 14 (ref 9–23)
BUN: 12 mg/dL (ref 6–20)
Bilirubin Total: 0.3 mg/dL (ref 0.0–1.2)
CO2: 21 mmol/L (ref 20–29)
Calcium: 9.8 mg/dL (ref 8.7–10.2)
Chloride: 101 mmol/L (ref 96–106)
Creatinine, Ser: 0.85 mg/dL (ref 0.57–1.00)
Globulin, Total: 2.5 g/dL (ref 1.5–4.5)
Glucose: 85 mg/dL (ref 70–99)
Potassium: 4.8 mmol/L (ref 3.5–5.2)
Sodium: 136 mmol/L (ref 134–144)
Total Protein: 6.8 g/dL (ref 6.0–8.5)
eGFR: 92 mL/min/{1.73_m2} (ref >59)

## 2021-07-28 LAB — LIPID PANEL
Chol/HDL Ratio: 3.9 ratio (ref 0.0–4.4)
Cholesterol, Total: 220 mg/dL — ABNORMAL HIGH (ref 100–199)
HDL: 57 mg/dL (ref >39)
LDL Chol Calc (NIH): 130 mg/dL — ABNORMAL HIGH (ref 0–99)
Triglycerides: 186 mg/dL — ABNORMAL HIGH (ref 0–149)
VLDL Cholesterol Cal: 33 mg/dL (ref 5–40)

## 2021-07-28 LAB — TSH+FREE T4
Free T4: 1.05 ng/dL (ref 0.82–1.77)
TSH: 3.32 u[IU]/mL (ref 0.450–4.500)

## 2021-07-28 LAB — HEMOGLOBIN A1C
Est. average glucose Bld gHb Est-mCnc: 97 mg/dL
Hgb A1c MFr Bld: 5 % (ref 4.8–5.6)

## 2021-07-28 LAB — HEPATITIS C ANTIBODY: Hep C Virus Ab: NONREACTIVE

## 2021-08-11 ENCOUNTER — Ambulatory Visit (HOSPITAL_COMMUNITY)
Admission: RE | Admit: 2021-08-11 | Discharge: 2021-08-11 | Disposition: A | Discharge status: Home / Self Care | Payer: BLUE CROSS/BLUE SHIELD | Source: Ambulatory Visit | Attending: Nurse Practitioner | Admitting: Nurse Practitioner

## 2021-08-11 DIAGNOSIS — E049 Nontoxic goiter, unspecified: Secondary | ICD-10-CM | POA: Diagnosis present

## 2021-08-14 ENCOUNTER — Encounter: Payer: Self-pay | Admitting: Nurse Practitioner

## 2021-08-20 ENCOUNTER — Ambulatory Visit
Admission: EM | Admit: 2021-08-20 | Discharge: 2021-08-20 | Disposition: A | Discharge status: Home / Self Care | Payer: BLUE CROSS/BLUE SHIELD | Attending: Urgent Care | Admitting: Urgent Care

## 2021-08-20 DIAGNOSIS — R070 Pain in throat: Secondary | ICD-10-CM | POA: Diagnosis not present

## 2021-08-20 DIAGNOSIS — J309 Allergic rhinitis, unspecified: Secondary | ICD-10-CM | POA: Diagnosis not present

## 2021-08-20 DIAGNOSIS — J069 Acute upper respiratory infection, unspecified: Secondary | ICD-10-CM | POA: Diagnosis not present

## 2021-08-20 DIAGNOSIS — R0982 Postnasal drip: Secondary | ICD-10-CM | POA: Diagnosis present

## 2021-08-20 LAB — POCT RAPID STREP A (OFFICE): Rapid Strep A Screen: NEGATIVE

## 2021-08-20 MED ORDER — PREDNISONE 10 MG PO TABS: 40.0000 mg | ORAL_TABLET | Freq: Every day | ORAL | 0 refills | Status: DC

## 2021-08-20 MED ORDER — LEVOCETIRIZINE DIHYDROCHLORIDE 5 MG PO TABS: 5.0000 mg | ORAL_TABLET | Freq: Every evening | ORAL | 0 refills | Status: DC

## 2021-08-20 NOTE — ED Provider Notes (Addendum)
?[facility] ? ? ?MRN: [id number] DOB: April 10, 1986 ? ?Subjective:  ? ?Audrey Vega is a 36 y.o. female presenting for 1 week history of persistent sinus drainage, throat pain, slight cough.  Denies chest pain, shortness of breath, wheezing, ear pain.  She is not a smoker.  Of note, patient did undergo an antibiotic course with Augmentin 2 months ago.  This was for sinusitis. ? ?No current facility-administered medications for this encounter. ? ?Current Outpatient Medications:  ?  vitamin B-12 (CYANOCOBALAMIN) 1000 MCG tablet, Take 1,000 mcg by mouth daily., Disp: , Rfl:  ?  naproxen sodium (ALEVE) 220 MG tablet, Take 220 mg by mouth daily as needed., Disp: , Rfl:  ?  norgestimate-ethinyl estradiol (ORTHO-CYCLEN,SPRINTEC,PREVIFEM) 0.25-35 MG-MCG tablet, Take 1 tablet by mouth daily., Disp: , Rfl:  ?  valACYclovir (VALTREX) 1000 MG tablet, Take 2 tablets (2,000 mg total) by mouth 2 (two) times daily. Take 2 tablets twice a day at the first sign of an out break for ONE day., Disp: 20 tablet, Rfl: 0  ? ?Allergies  ?Allergen Reactions  ? Other Itching  ?  Vick's vapor rub  ? Oxycodone-Acetaminophen Itching  ? Promethazine Other (See Comments)  ?  Tardive dyskinesia  ? ? ?Past Medical History:  ?Diagnosis Date  ? Chronic salpingitis and oophoritis   ? Depression   ? Headache   ? History of Hirschsprung's disease   ? INFANT --  S/P  SURGICAL REPAIR  ? Intrauterine synechiae   ?  ? ?Past Surgical History:  ?Procedure Laterality Date  ? CESAREAN SECTION  2009  ? COLON SURGERY  INFANT  ? HIRSCHSPRUNG'S DISEASE  ? LAPAROSCOPY N/A 12/09/2013  ? Procedure: LAPAROSCOPY LYSIS OF ADHESIONS, right partial SALPINGECTOMY, left fimbrioplasty, enterolysis;  Surgeon: Fermin Schwab, MD;  Location: [facility];  Service: Gynecology;  Laterality: N/A;  ? ? ?History reviewed. No pertinent family history. ? ?Social History  ? ?Tobacco Use  ? Smoking status: Never  ? Smokeless tobacco: Never  ?Substance Use  Topics  ? Alcohol use: Yes  ?  Comment: OCCASIONAL  ? Drug use: No  ? ? ?ROS ? ? ?Objective:  ? ?Vitals: ?BP 111/79 (BP Location: Right Arm)   Pulse 79   Temp 98.8 ?F (37.1 ?C) (Oral)   Resp 18   SpO2 98%   Breastfeeding No  ? ?Physical Exam ?Constitutional:   ?   General: She is not in acute distress. ?   Appearance: Normal appearance. She is well-developed and normal weight. She is not ill-appearing, toxic-appearing or diaphoretic.  ?HENT:  ?   Head: Normocephalic and atraumatic.  ?   Right Ear: Tympanic membrane, ear canal and external ear normal. No drainage, swelling or tenderness. No middle ear effusion. There is no impacted cerumen. Tympanic membrane is not injected, perforated, erythematous or bulging.  ?   Left Ear: Tympanic membrane, ear canal and external ear normal. No drainage, swelling or tenderness.  No middle ear effusion. There is no impacted cerumen. Tympanic membrane is not injected, perforated, erythematous or bulging.  ?   Nose: Congestion present. No rhinorrhea.  ?   Mouth/Throat:  ?   Mouth: Mucous membranes are moist. No oral lesions.  ?   Pharynx: No pharyngeal swelling, oropharyngeal exudate, posterior oropharyngeal erythema or uvula swelling.  ?   Tonsils: No tonsillar exudate or tonsillar abscesses. 0 on the right. 0 on the left.  ?Eyes:  ?   General: No scleral icterus.    ?  Right eye: No discharge.     ?   Left eye: No discharge.  ?   Extraocular Movements: Extraocular movements intact.  ?   Right eye: Normal extraocular motion.  ?   Left eye: Normal extraocular motion.  ?   Conjunctiva/sclera: Conjunctivae normal.  ?Cardiovascular:  ?   Rate and Rhythm: Normal rate.  ?   Heart sounds: No murmur heard. ?  No friction rub. No gallop.  ?Pulmonary:  ?   Effort: Pulmonary effort is normal. No respiratory distress.  ?   Breath sounds: No stridor. No wheezing, rhonchi or rales.  ?Chest:  ?   Chest wall: No tenderness.  ?Musculoskeletal:  ?   Cervical back: Normal range of motion and  neck supple.  ?Lymphadenopathy:  ?   Cervical: No cervical adenopathy.  ?Skin: ?   General: Skin is warm and dry.  ?Neurological:  ?   General: No focal deficit present.  ?   Mental Status: She is alert and oriented to person, place, and time.  ?Psychiatric:     ?   Mood and Affect: Mood normal.     ?   Behavior: Behavior normal.  ? ?Results for orders placed or performed during the hospital encounter of 08/20/21 (from the past 24 hour(s))  ?POCT rapid strep A     Status: None  ? Collection Time: 08/20/21  9:33 AM  ?Result Value Ref Range  ? Rapid Strep A Screen Negative Negative  ? ? ?Assessment and Plan :  ? ?PDMP not reviewed this encounter. ? ?1. Viral upper respiratory tract infection   ?2. Throat pain   ?3. Post-nasal drainage   ?4. Allergic rhinitis, unspecified seasonality, unspecified trigger   ? ?Strep culture pending.  Patient requested aggressive management and therefore I offered her an oral prednisone course to help her with her sinuses and throat pain, postnasal drainage. Deferred imaging given clear cardiopulmonary exam, hemodynamically stable vital signs.  Otherwise, suspect viral URI, viral syndrome. Physical exam findings reassuring and vital signs stable for discharge. Advised supportive care, offered symptomatic relief.  Recommended avoiding another round of antibiotics as I suspect she has underlying untreated allergic rhinitis.  Counseled patient on potential for adverse effects with medications prescribed/recommended today, ER and return-to-clinic precautions discussed, patient verbalized understanding.  ? ? ?  ?Wallis Bamberg, PA-C ?08/20/21 0981 ? ?

## 2021-08-20 NOTE — ED Triage Notes (Signed)
Pt reports sore throat x 1 week 

## 2021-08-23 LAB — CULTURE, GROUP A STREP (THRC)

## 2022-02-01 ENCOUNTER — Ambulatory Visit: Payer: Self-pay | Admitting: Nurse Practitioner

## 2022-04-01 ENCOUNTER — Ambulatory Visit
Admission: EM | Admit: 2022-04-01 | Discharge: 2022-04-01 | Disposition: A | Discharge status: Home / Self Care | Payer: BLUE CROSS/BLUE SHIELD | Attending: Nurse Practitioner | Admitting: Nurse Practitioner

## 2022-04-01 ENCOUNTER — Encounter: Payer: Self-pay | Admitting: Emergency Medicine

## 2022-04-01 DIAGNOSIS — J069 Acute upper respiratory infection, unspecified: Secondary | ICD-10-CM

## 2022-04-01 MED ORDER — PSEUDOEPH-BROMPHEN-DM 30-2-10 MG/5ML PO SYRP: 5.0000 mL | ORAL_SOLUTION | Freq: Four times a day (QID) | ORAL | 0 refills | Status: DC | PRN

## 2022-04-01 MED ORDER — FLUTICASONE PROPIONATE 50 MCG/ACT NA SUSP: 2.0000 | Freq: Every day | NASAL | 0 refills | Status: DC

## 2022-04-01 NOTE — Discharge Instructions (Addendum)
Take medication as prescribed. Increase fluids and allow for plenty of rest. Recommend Tylenol or ibuprofen as needed for pain, fever, or general discomfort. Warm salt water gargles 3-4 times daily to help with throat pain or discomfort. Also recommend resting your voice while symptoms persist. Recommend using a humidifier at bedtime during sleep to help with cough and nasal congestion. Sleep elevated on 2 pillows while cough symptoms persist. If symptoms do not improve over the next 5-7 days, follow up with our clinic or with your PCP for further evaluation.

## 2022-04-01 NOTE — ED Provider Notes (Signed)
[facility]    CSN: [id number] Arrival date & time: 04/01/22  0808      History   Chief Complaint No chief complaint on file.   HPI Audrey Vega is a 36 y.o. female.   The history is provided by the patient.   Patient presents for complaints of sore throat, headache, generalized body aches, and cough that been present for the past week.  Patient denies fever, chills, ear pain, wheezing, shortness of breath, or difficulty breathing.  Patient states approximately 3 days ago, she lost her voice, but it is slowly improving.  She states that he has not had any known sick contacts.  She has been taking over-the-counter medications including Motrin, DayQuil, and NyQuil, for her symptoms.  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   Headache 09/17/2018   Plantar wart 09/17/2018   Shoulder pain 09/17/2018   Goiter 07/25/2016   Indication for care in labor or delivery 12/17/2014   Postpartum state 12/17/2014   Oligo-ovulation 03/08/2014    Past Surgical History:  Procedure Laterality Date   CESAREAN SECTION  2009   COLON SURGERY  INFANT   HIRSCHSPRUNG'S DISEASE   LAPAROSCOPY N/A 12/09/2013   Procedure: LAPAROSCOPY LYSIS OF ADHESIONS, right partial SALPINGECTOMY, left fimbrioplasty, enterolysis;  Surgeon: Fermin Schwab, MD;  Location: [facility];  Service: Gynecology;  Laterality: N/A;    OB History     Gravida  2   Para  2   Term  2   Preterm      AB      Living  1      SAB      IAB      Ectopic      Multiple  0   Live Births  1            Home Medications    Prior to Admission medications   Medication Sig Start Date End Date Taking? Authorizing Provider   brompheniramine-pseudoephedrine-DM 30-2-10 MG/5ML syrup Take 5 mLs by mouth 4 (four) times daily as needed. 04/01/22  Yes Winnifred Dufford-Warren, Sadie Haber, NP  fluticasone (FLONASE) 50 MCG/ACT nasal spray Place 2 sprays into both nostrils daily. 04/01/22  Yes Travoris Bushey-Warren, Sadie Haber, NP  levocetirizine (XYZAL) 5 MG tablet Take 1 tablet (5 mg total) by mouth every evening. 08/20/21   Wallis Bamberg, PA-C  naproxen sodium (ALEVE) 220 MG tablet Take 220 mg by mouth daily as needed.    [provider]  norgestimate-ethinyl estradiol (ORTHO-CYCLEN,SPRINTEC,PREVIFEM) 0.25-35 MG-MCG tablet Take 1 tablet by mouth daily.    [provider]  predniSONE (DELTASONE) 10 MG tablet Take 4 tablets (40 mg total) by mouth daily with breakfast. 08/20/21   Wallis Bamberg, PA-C  valACYclovir (VALTREX) 1000 MG tablet Take 2 tablets (2,000 mg total) by mouth 2 (two) times daily. Take 2 tablets twice a day at the first sign of an out break for ONE day. 07/27/21   [employer], Alvino Chapel, FNP  vitamin B-12 (CYANOCOBALAMIN) 1000 MCG tablet Take 1,000 mcg by mouth daily.    [provider]    Family History History reviewed. No pertinent family history.  Social History Social History   Tobacco Use  Smoking status: Never   Smokeless tobacco: Never  Substance Use Topics   Alcohol use: Yes    Comment: OCCASIONAL   Drug use: No     Allergies   Other, Oxycodone-acetaminophen, and Promethazine   Review of Systems Review of Systems Per HPI  Physical Exam Triage Vital Signs ED Triage Vitals  Enc Vitals Group     BP 04/01/22 0830 109/74     Pulse Rate 04/01/22 0830 82     Resp 04/01/22 0830 18     Temp 04/01/22 0830 98.9 F (37.2 C)     Temp Source 04/01/22 0830 Oral     SpO2 04/01/22 0830 98 %     Weight --      Height --      Head Circumference --      Peak Flow --      Pain Score 04/01/22 0831 8     Pain Loc --      Pain Edu? --      Excl. in GC? --    No data found.  Updated Vital  Signs BP 109/74 (BP Location: Right Arm)   Pulse 82   Temp 98.9 F (37.2 C) (Oral)   Resp 18   SpO2 98%   Visual Acuity Right Eye Distance:   Left Eye Distance:   Bilateral Distance:    Right Eye Near:   Left Eye Near:    Bilateral Near:     Physical Exam Vitals and nursing note reviewed.  Constitutional:      General: She is not in acute distress.    Appearance: Normal appearance.  HENT:     Head: Normocephalic.     Right Ear: Tympanic membrane, ear canal and external ear normal.     Left Ear: Tympanic membrane, ear canal and external ear normal.     Nose: Congestion present. No rhinorrhea.     Mouth/Throat:     Lips: Pink.     Mouth: Mucous membranes are moist.     Pharynx: Uvula midline. Posterior oropharyngeal erythema present. No pharyngeal swelling.     Tonsils: No tonsillar exudate. 0 on the right. 0 on the left.  Eyes:     Pupils: Pupils are equal, round, and reactive to light.  Cardiovascular:     Rate and Rhythm: Normal rate and regular rhythm.     Pulses: Normal pulses.     Heart sounds: Normal heart sounds.  Pulmonary:     Effort: Pulmonary effort is normal. No respiratory distress.     Breath sounds: Normal breath sounds. No stridor. No wheezing, rhonchi or rales.  Abdominal:     General: Bowel sounds are normal.     Palpations: Abdomen is soft.     Tenderness: There is no abdominal tenderness.  Musculoskeletal:     Cervical back: Normal range of motion.  Lymphadenopathy:     Cervical: No cervical adenopathy.  Skin:    General: Skin is warm and dry.  Neurological:     General: No focal deficit present.     Mental Status: She is alert and oriented to person, place, and time.  Psychiatric:        Mood and Affect: Mood normal.        Behavior: Behavior normal.      UC Treatments / Results  Labs (all labs ordered are listed, but only abnormal results are displayed) Labs Reviewed - No data to display  EKG   Radiology No results  found.  Procedures Procedures (including critical care time)  Medications Ordered in UC Medications - No data to display  Initial Impression / Assessment and Plan / UC Course  I have reviewed the triage vital signs and the nursing notes.  Pertinent labs & imaging results that were available during my care of the patient were reviewed by me and considered in my medical decision making (see chart for details).  Patient presents for complaints of upper respiratory symptoms that been present over the past week.  On exam, patient is well-appearing, she is in no acute distress, vital signs are stable.  Symptoms are consistent with a viral upper respiratory infection.  Patient declines COVID/flu testing at this time.  Will provide symptomatic treatment for the patient with Bromfed dm and fluticasone nasal spray for her nasal congestion.  Supportive care recommendations were provided to the patient to include increasing fluids, allowing for plenty of rest, and warm salt water gargles.  Patient was advised that if symptoms fail to improve over the next 5 to 7 days, to follow-up in this clinic or with her PCP for reevaluation.  Patient verbalizes understanding.  All questions were answered.  Patient stable for discharge. Final Clinical Impressions(s) / UC Diagnoses   Final diagnoses:  Viral upper respiratory tract infection with cough     Discharge Instructions      Take medication as prescribed. Increase fluids and allow for plenty of rest. Recommend Tylenol or ibuprofen as needed for pain, fever, or general discomfort. Warm salt water gargles 3-4 times daily to help with throat pain or discomfort. Also recommend resting your voice while symptoms persist. Recommend using a humidifier at bedtime during sleep to help with cough and nasal congestion. Sleep elevated on 2 pillows while cough symptoms persist. If symptoms do not improve over the next 5-7 days, follow up with our clinic or with your  PCP for further evaluation.       ED Prescriptions     Medication Sig Dispense Auth. Provider   brompheniramine-pseudoephedrine-DM 30-2-10 MG/5ML syrup Take 5 mLs by mouth 4 (four) times daily as needed. 140 mL Mairlyn Tegtmeyer-Warren, Sadie Haber, NP   fluticasone (FLONASE) 50 MCG/ACT nasal spray Place 2 sprays into both nostrils daily. 16 g Laketha Leopard-Warren, Sadie Haber, NP      PDMP not reviewed this encounter.   Abran Cantor, NP 04/01/22 [phone]

## 2022-04-01 NOTE — ED Triage Notes (Signed)
Sore throat, headache, body ache, cough x 1 week.  Has been taking motrin and dayquil and nyquil to treat symptoms

## 2023-05-17 ENCOUNTER — Ambulatory Visit: Payer: BLUE CROSS/BLUE SHIELD | Admitting: Internal Medicine

## 2023-07-05 ENCOUNTER — Ambulatory Visit: Payer: BLUE CROSS/BLUE SHIELD | Admitting: Internal Medicine

## 2023-08-28 ENCOUNTER — Encounter: Payer: Self-pay | Admitting: Family Medicine

## 2023-08-28 ENCOUNTER — Ambulatory Visit (INDEPENDENT_AMBULATORY_CARE_PROVIDER_SITE_OTHER): Admitting: Family Medicine

## 2023-08-28 VITALS — BP 128/66 | HR 71 | Resp 18 | Ht 63.0 in | Wt 207.6 lb

## 2023-08-28 DIAGNOSIS — E049 Nontoxic goiter, unspecified: Secondary | ICD-10-CM

## 2023-08-28 DIAGNOSIS — Z0001 Encounter for general adult medical examination with abnormal findings: Secondary | ICD-10-CM

## 2023-08-28 DIAGNOSIS — L84 Corns and callosities: Secondary | ICD-10-CM

## 2023-08-28 DIAGNOSIS — Z6836 Body mass index (BMI) 36.0-36.9, adult: Secondary | ICD-10-CM

## 2023-08-28 DIAGNOSIS — E66812 Obesity, class 2: Secondary | ICD-10-CM | POA: Insufficient documentation

## 2023-08-28 DIAGNOSIS — Z0000 Encounter for general adult medical examination without abnormal findings: Secondary | ICD-10-CM | POA: Insufficient documentation

## 2023-08-28 NOTE — Patient Instructions (Signed)
 It was great to meet you today and I'm excited to have you join the [facility] practice. I hope you had a positive experience today! If you feel so inclined, please feel free to recommend our practice to friends and family. Kurtis Bushman, FNP-C

## 2023-08-28 NOTE — Assessment & Plan Note (Signed)
 Counseled on importance of weight management for overall health. Encouraged low calorie, heart healthy diet and moderate intensity exercise 150 minutes weekly. This is 3-5 times weekly for 30-50 minutes each session. Goal should be pace of 3 miles/hours, or walking 1.5 miles in 30 minutes and include strength training. Discussed risks of obesity.

## 2023-08-28 NOTE — Assessment & Plan Note (Signed)

## 2023-08-28 NOTE — Assessment & Plan Note (Signed)
 Has tried debridement with pumice stone. Referral to podiatry for removal.

## 2023-08-28 NOTE — Progress Notes (Signed)
 New Patient Office Visit  Subjective    Patient ID: Audrey Vega, female    DOB: 1985-10-31  Age: 38 y.o. MRN: [id number]  CC:  Chief Complaint  Patient presents with   Establish Care    New pt     HPI Audrey Vega presents to establish care. Oriented to practice routines and expectations. No recent PCP. PMH includes thyroid  goiter, HSV-1. She does see OBGYN annually at [facility] and opthamology. Other concerns include corn on her left foot.  Cervical CA screening: approximate date 2024 and was normal Tobacco: non-smoker STI: declines Vaccines:  UTD    Outpatient Encounter Medications as of 08/28/2023  Medication Sig   norgestimate-ethinyl estradiol (ORTHO-CYCLEN,SPRINTEC,PREVIFEM) 0.25-35 MG-MCG tablet Take 1 tablet by mouth daily.   vitamin B-12 (CYANOCOBALAMIN) 1000 MCG tablet Take 1,000 mcg by mouth daily.   fluticasone  (FLONASE ) 50 MCG/ACT nasal spray Place 2 sprays into both nostrils daily.   levocetirizine (XYZAL ) 5 MG tablet Take 1 tablet (5 mg total) by mouth every evening.   naproxen sodium (ALEVE) 220 MG tablet Take 220 mg by mouth daily as needed.   valACYclovir  (VALTREX ) 1000 MG tablet Take 2 tablets (2,000 mg total) by mouth 2 (two) times daily. Take 2 tablets twice a day at the first sign of an out break for ONE day. (Patient not taking: Reported on 08/28/2023)   [DISCONTINUED] brompheniramine-pseudoephedrine-DM 30-2-10 MG/5ML syrup Take 5 mLs by mouth 4 (four) times daily as needed.   [DISCONTINUED] predniSONE  (DELTASONE ) 10 MG tablet Take 4 tablets (40 mg total) by mouth daily with breakfast.   No facility-administered encounter medications on file as of 08/28/2023.    Past Medical History:  Diagnosis Date   Chronic salpingitis and oophoritis    Depression    Headache    History of Hirschsprung's disease    INFANT --  S/P  SURGICAL REPAIR   Intrauterine synechiae     Past Surgical History:  Procedure Laterality Date   CESAREAN  SECTION  2009   COLON SURGERY  INFANT   HIRSCHSPRUNG'S DISEASE   LAPAROSCOPY N/A 12/09/2013   Procedure: LAPAROSCOPY LYSIS OF ADHESIONS, right partial SALPINGECTOMY, left fimbrioplasty, enterolysis;  Surgeon: Asa Bjork, MD;  Location: [facility];  Service: Gynecology;  Laterality: N/A;    History reviewed. No pertinent family history.  Social History   Socioeconomic History   Marital status: Married    Spouse name: Not on file   Number of children: Not on file   Years of education: Not on file   Highest education level: Not on file  Occupational History   Not on file  Tobacco Use   Smoking status: Never   Smokeless tobacco: Never  Substance and Sexual Activity   Alcohol use: Yes    Comment: OCCASIONAL   Drug use: No   Sexual activity: Yes    Comment: last intercourse 2-3 wks ago  Other Topics Concern   Not on file  Social History Narrative   Not on file   Social Drivers of Health   Financial Resource Strain: Not on file  Food Insecurity: No Food Insecurity (08/28/2023)   Hunger Vital Sign    Worried About Running Out of Food in the Last Year: Never true    Ran Out of Food in the Last Year: Never true  Transportation Needs: No Transportation Needs (08/28/2023)   PRAPARE - Administrator, Civil Service (Medical): No    Lack of Transportation (Non-Medical): No  Physical  Activity: Insufficiently Active (08/28/2023)   Exercise Vital Sign    Days of Exercise per Week: 3 days    Minutes of Exercise per Session: 30 min  Stress: Not on file  Social Connections: Unknown (09/16/2021)   Received from [facility]   Social Network    Social Network: Not on file  Intimate Partner Violence: Not At Risk (08/28/2023)   Humiliation, Afraid, Rape, and Kick questionnaire    Fear of Current or Ex-Partner: No    Emotionally Abused: No    Physically Abused: No    Sexually Abused: No    Review of Systems  Constitutional: Negative.    HENT: Negative.    Eyes: Negative.   Respiratory: Negative.    Cardiovascular: Negative.   Gastrointestinal: Negative.   Genitourinary: Negative.   Musculoskeletal: Negative.   Skin: Negative.   Neurological: Negative.   Endo/Heme/Allergies: Negative.   Psychiatric/Behavioral: Negative.    All other systems reviewed and are negative.       Objective    BP 128/66   Pulse 71   Resp 18   Ht 5\' 3"  (1.6 m)   Wt 207 lb 9.6 oz (94.2 kg)   SpO2 99%   BMI 36.77 kg/m   Physical Exam Vitals and nursing note reviewed.  Constitutional:      Appearance: Normal appearance. She is obese.  HENT:     Head: Normocephalic and atraumatic.     Right Ear: Tympanic membrane, ear canal and external ear normal.     Left Ear: Tympanic membrane, ear canal and external ear normal.     Nose: Nose normal.     Mouth/Throat:     Mouth: Mucous membranes are moist.     Pharynx: Oropharynx is clear.  Eyes:     Extraocular Movements: Extraocular movements intact.     Conjunctiva/sclera: Conjunctivae normal.     Pupils: Pupils are equal, round, and reactive to light.  Neck:     Thyroid : Thyromegaly present.  Cardiovascular:     Rate and Rhythm: Normal rate and regular rhythm.     Pulses: Normal pulses.     Heart sounds: Normal heart sounds.  Pulmonary:     Effort: Pulmonary effort is normal.     Breath sounds: Normal breath sounds.  Abdominal:     General: Bowel sounds are normal.     Palpations: Abdomen is soft.  Musculoskeletal:        General: Normal range of motion.     Cervical back: Normal range of motion and neck supple.  Skin:    General: Skin is warm and dry.     Capillary Refill: Capillary refill takes less than 2 seconds.  Neurological:     General: No focal deficit present.     Mental Status: She is alert and oriented to person, place, and time. Mental status is at baseline.  Psychiatric:        Mood and Affect: Mood normal.        Behavior: Behavior normal.        Thought  Content: Thought content normal.        Judgment: Judgment normal.         Assessment & Plan:   Problem List Items Addressed This Visit     Goiter   Euthyroid on exam, Thyroid  US  2023 showed mildly enlarged thyroid  only. Will check TSH.      Physical exam, annual - Primary   Today your medical history was reviewed and  routine physical exam with labs was performed. Recommend 150 minutes of moderate intensity exercise weekly and consuming a well-balanced diet. Advised to stop smoking if a smoker, avoid smoking if a non-smoker, limit alcohol consumption to 1 drink per day for women and 2 drinks per day for men, and avoid illicit drug use. Counseled on safe sex practices and offered STI testing today. Counseled on the importance of sunscreen use. Counseled in mental health awareness and when to seek medical care. Vaccine maintenance discussed. Appropriate health maintenance items reviewed. Return to office in 1 year for annual physical exam.       Relevant Orders   CBC with Differential/Platelet   Comprehensive metabolic panel with GFR   Lipid panel   TSH   Hemoglobin A1c   Ambulatory referral to Podiatry   Class 2 obesity without serious comorbidity with body mass index (BMI) of 36.0 to 36.9 in adult   Counseled on importance of weight management for overall health. Encouraged low calorie, heart healthy diet and moderate intensity exercise 150 minutes weekly. This is 3-5 times weekly for 30-50 minutes each session. Goal should be pace of 3 miles/hours, or walking 1.5 miles in 30 minutes and include strength training. Discussed risks of obesity.       Relevant Orders   CBC with Differential/Platelet   Comprehensive metabolic panel with GFR   Lipid panel   TSH   Hemoglobin A1c   Ambulatory referral to Podiatry   Corn of foot   Has tried debridement with pumice stone. Referral to podiatry for removal.      Relevant Orders   Ambulatory referral to Podiatry    Return in about 1  year (around 08/27/2024) for annual physical with labs 1 week prior.   Jenelle Mis, FNP

## 2023-08-28 NOTE — Assessment & Plan Note (Signed)
 Euthyroid on exam, Thyroid  US  2023 showed mildly enlarged thyroid  only. Will check TSH.

## 2023-08-29 ENCOUNTER — Encounter: Payer: Self-pay | Admitting: Family Medicine

## 2023-09-01 LAB — TEST AUTHORIZATION

## 2023-09-01 LAB — CBC WITH DIFFERENTIAL/PLATELET
Absolute Lymphocytes: 1739 {cells}/uL (ref 850–3900)
Absolute Monocytes: 410 {cells}/uL (ref 200–950)
Basophils Absolute: 22 {cells}/uL (ref 0–200)
Basophils Relative: 0.4 %
Eosinophils Absolute: 70 {cells}/uL (ref 15–500)
Eosinophils Relative: 1.3 %
HCT: 43.3 % (ref 35.0–45.0)
Hemoglobin: 14.4 g/dL (ref 11.7–15.5)
MCH: 30.6 pg (ref 27.0–33.0)
MCHC: 33.3 g/dL (ref 32.0–36.0)
MCV: 91.9 fL (ref 80.0–100.0)
MPV: 11.1 fL (ref 7.5–12.5)
Monocytes Relative: 7.6 %
Neutro Abs: 3159 {cells}/uL (ref 1500–7800)
Neutrophils Relative %: 58.5 %
Platelets: 275 10*3/uL (ref 140–400)
RBC: 4.71 10*6/uL (ref 3.80–5.10)
RDW: 12.2 % (ref 11.0–15.0)
Total Lymphocyte: 32.2 %
WBC: 5.4 10*3/uL (ref 3.8–10.8)

## 2023-09-01 LAB — COMPREHENSIVE METABOLIC PANEL WITH GFR
AG Ratio: 1.4 (calc) (ref 1.0–2.5)
ALT: 19 U/L (ref 6–29)
AST: 23 U/L (ref 10–30)
Albumin: 4 g/dL (ref 3.6–5.1)
Alkaline phosphatase (APISO): 59 U/L (ref 31–125)
BUN: 11 mg/dL (ref 7–25)
CO2: 23 mmol/L (ref 20–32)
Calcium: 9.3 mg/dL (ref 8.6–10.2)
Chloride: 104 mmol/L (ref 98–110)
Creat: 0.85 mg/dL (ref 0.50–0.97)
Globulin: 2.9 g/dL (ref 1.9–3.7)
Glucose, Bld: 77 mg/dL (ref 65–99)
Potassium: 4.6 mmol/L (ref 3.5–5.3)
Sodium: 138 mmol/L (ref 135–146)
Total Bilirubin: 0.3 mg/dL (ref 0.2–1.2)
Total Protein: 6.9 g/dL (ref 6.1–8.1)
eGFR: 90 mL/min/{1.73_m2} (ref >=60)

## 2023-09-01 LAB — LIPID PANEL
Cholesterol: 212 mg/dL — ABNORMAL HIGH (ref <200)
HDL: 63 mg/dL (ref >=50)
LDL Cholesterol (Calc): 111 mg/dL — ABNORMAL HIGH
Non-HDL Cholesterol (Calc): 149 mg/dL — ABNORMAL HIGH (ref <130)
Total CHOL/HDL Ratio: 3.4 (calc) (ref <5.0)
Triglycerides: 263 mg/dL — ABNORMAL HIGH (ref <150)

## 2023-09-01 LAB — HEMOGLOBIN A1C
Hgb A1c MFr Bld: 5.2 % (ref <5.7)
Mean Plasma Glucose: 103 mg/dL
eAG (mmol/L): 5.7 mmol/L

## 2023-09-01 LAB — TSH: TSH: 5.58 m[IU]/L — ABNORMAL HIGH

## 2023-09-01 LAB — T3, FREE: T3, Free: 2.8 pg/mL (ref 2.3–4.2)

## 2023-09-01 LAB — T4, FREE: Free T4: 1.1 ng/dL (ref 0.8–1.8)

## 2023-09-10 ENCOUNTER — Ambulatory Visit (INDEPENDENT_AMBULATORY_CARE_PROVIDER_SITE_OTHER): Admitting: Podiatry

## 2023-09-10 DIAGNOSIS — D2372 Other benign neoplasm of skin of left lower limb, including hip: Secondary | ICD-10-CM

## 2023-09-10 DIAGNOSIS — M722 Plantar fascial fibromatosis: Secondary | ICD-10-CM

## 2023-09-10 NOTE — Patient Instructions (Signed)

## 2023-09-10 NOTE — Progress Notes (Signed)
     Chief Complaint  Patient presents with   Callouses    Left foot. Ball of foot. Present for a couple of months. Has tried soaking foot and using pumice stone. No otc treatments.    HPI: 38 y.o. female presents today with concern of a painful skin lesion near the left submet 2 area.  Denies stepping on a foreign object.  Denies any drainage.  Also notes concern of possible plantar fasciitis returning to her feet.  She states that when she has been on her feet at work all day walking almost 20,000 steps per day, or when getting out of her car once home, she has pain when trying to stand again.  She has had this previously.  She states that she had gotten all of the splints and braces in the past.  Past Medical History:  Diagnosis Date   Chronic salpingitis and oophoritis    Depression    Headache    History of Hirschsprung's disease    INFANT --  S/P  SURGICAL REPAIR   Intrauterine synechiae     Past Surgical History:  Procedure Laterality Date   CESAREAN SECTION  2009   COLON SURGERY  INFANT   HIRSCHSPRUNG'S DISEASE   LAPAROSCOPY N/A 12/09/2013   Procedure: LAPAROSCOPY LYSIS OF ADHESIONS, right partial SALPINGECTOMY, left fimbrioplasty, enterolysis;  Surgeon: Asa Bjork, MD;  Location: [facility];  Service: Gynecology;  Laterality: N/A;    Allergies  Allergen Reactions   Other Itching    Vick's vapor rub   Oxycodone -Acetaminophen  Itching   Promethazine  Other (See Comments)    Tardive dyskinesia     Physical Exam: Palpable pedal pulses noted.  Interspaces are clear.  There is a focal hyperkeratotic lesion left submet 2.  No surrounding erythema is noted.  No evidence of foreign body is seen.  There is discomfort on palpation to the plantar aspect of both heels and along the proximal plantar fascial bands bilateral.  Ankle dorsiflexion is less than 10 degrees with the knee extended bilateral.  Epicritic sensation is intact  Assessment/Plan of Care: 1.  Benign neoplasm of skin of left foot   2. Plantar fasciitis of left foot   3. Plantar fasciitis of right foot    Discussed clinical findings with patient today.  The left submet 2 lesion was shaved with sterile #313 blade and 2 applications of Cantharone solution were applied followed by a Band-Aid for occlusion.  She will remove this in 5 to 7 hours.  May expect blistering tomorrow.  Reprinted stretching exercises for the patient to receive at checkout.  She will start performing these daily.  Will recheck the plantar fasciitis at her follow-up appointment in 4 weeks.  Patient to return to some of her medical devices that were dispensed in the past if she still has them at home for additional support in the meantime.   Joe Murders, DPM, FACFAS [facility]     [address] [place], [place] [address]                Office [phone]  Fax [phone]

## 2023-10-15 ENCOUNTER — Ambulatory Visit: Admitting: Podiatry

## 2023-11-05 ENCOUNTER — Ambulatory Visit: Admitting: Family Medicine

## 2024-05-22 ENCOUNTER — Encounter: Payer: Self-pay | Admitting: Family Medicine

## 2024-05-22 ENCOUNTER — Ambulatory Visit (INDEPENDENT_AMBULATORY_CARE_PROVIDER_SITE_OTHER): Admitting: Family Medicine

## 2024-05-22 VITALS — BP 128/78 | HR 78 | Temp 98.8°F | Ht 63.0 in | Wt 201.0 lb

## 2024-05-22 DIAGNOSIS — R6889 Other general symptoms and signs: Secondary | ICD-10-CM | POA: Diagnosis not present

## 2024-05-22 DIAGNOSIS — J029 Acute pharyngitis, unspecified: Secondary | ICD-10-CM

## 2024-05-22 DIAGNOSIS — R051 Acute cough: Secondary | ICD-10-CM

## 2024-05-22 LAB — INFLUENZA A AND B AG, IMMUNOASSAY
INFLUENZA A ANTIGEN: NOT DETECTED
INFLUENZA B ANTIGEN: NOT DETECTED

## 2024-05-22 LAB — POC COVID19 BINAXNOW: SARS Coronavirus 2 Ag: NEGATIVE

## 2024-05-22 MED ORDER — HYDROCODONE BIT-HOMATROP MBR 5-1.5 MG/5ML PO SOLN: 5.0000 mL | Freq: Three times a day (TID) | ORAL | 0 refills | Status: AC | PRN

## 2024-05-22 MED ORDER — BENZONATATE 100 MG PO CAPS: 100.0000 mg | ORAL_CAPSULE | Freq: Three times a day (TID) | ORAL | 0 refills | Status: AC | PRN

## 2024-05-22 NOTE — Progress Notes (Signed)
 "  Patient Office Visit  Assessment & Plan:  Flu-like symptoms -     Influenza A and B Ag, Immunoassay  Sore throat -     POC COVID-19 BinaxNow  Acute cough -     POC COVID-19 BinaxNow -     Benzonatate ; Take 1 capsule (100 mg total) by mouth 3 (three) times daily as needed.  Dispense: 30 capsule; Refill: 0 -     HYDROcodone  Bit-Homatrop MBr; Take 5 mLs by mouth every 8 (eight) hours as needed for cough.  Dispense: 120 mL; Refill: 0   Assessment and Plan    Acute upper respiratory infection Negative for influenza and COVID-19. Likely viral etiology. No pneumonia. - Prescribed Tessalon  Perles for daytime cough. - Prescribed hydrocodone -containing cough syrup for nighttime. - Advised to report if symptoms persist.     Flu test and COVID test negative.  Patient made aware of this.  Patient will let us  know if not improving or worsening.     No follow-ups on file.   Subjective:    Patient ID: Audrey Vega, female    DOB: 14-Oct-1985  Age: 39 y.o. MRN: [id number]  Chief Complaint  Patient presents with   Sore Throat    Started on Monday- has gotten worse. Associated with cough.    Generalized Body Aches    Started on Wednesday- around shoulders/neck area.    Sore Throat    Discussed the use of AI scribe software for clinical note transcription with the patient, who gave verbal consent to proceed.  History of Present Illness        History of Present Illness Audrey Vega is a 39 year old female who presents with flu-like symptoms.  She has been experiencing symptoms since Monday, May 18, 2024, starting with a tickling sensation in her throat. Her condition has progressively worsened to include throat pain, cough, shoulder pain, and a sensation of heaviness in the chest.  She experiences intermittent shortness of breath, particularly when attempting deep breaths, which she finds challenging. She has no history of asthma or allergies and is a non-smoker. She  has not received a flu shot this season and typically does not get one.  Her symptoms have disrupted her sleep, as the cough frequently wakes her at night. She has tried DayQuil, cough drops, and honey with lemon without significant relief. She has not taken any Advil  or ibuprofen  today.  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  Physical Exam GENERAL: Oxygen saturation normal. HEENT: Throat with drainage, no redness.  Results Labs Influenza antigen (05/22/2024): Negative SARS-CoV-2 antigen (05/22/2024): Negative  Assessment and Plan Acute upper respiratory infection Negative for influenza and COVID-19. Likely viral etiology. No pneumonia. - Prescribed Tessalon  Perles for daytime cough. - Prescribed hydrocodone -containing cough syrup for nighttime. - Advised to report if symptoms persist.    The ASCVD Risk score (Arnett DK, et al., 2019) failed to calculate for the following reasons:   The 2019 ASCVD risk score is only valid for ages 78 to 66  Past Medical History:  Diagnosis Date   Chronic salpingitis and oophoritis    Depression    Headache    History of Hirschsprung's disease    INFANT --  S/P  SURGICAL REPAIR   Intrauterine synechiae    Past Surgical History:  Procedure Laterality Date   CESAREAN SECTION  2009   COLON SURGERY  INFANT  HIRSCHSPRUNG'S DISEASE   LAPAROSCOPY N/A 12/09/2013   Procedure: LAPAROSCOPY LYSIS OF ADHESIONS, right partial SALPINGECTOMY, left fimbrioplasty, enterolysis;  Surgeon: Cynthia Loss, MD;  Location: [facility];  Service: Gynecology;  Laterality: N/A;   Social History[1] History reviewed. No pertinent family history. Allergies[2]  ROS    Objective:    BP 128/78   Pulse 78   Temp 98.8 F (37.1 C)   Ht 5' 3 (1.6 m)   Wt 201 lb (91.2 kg)   SpO2 98%   BMI 35.61 kg/m  BP Readings from Last 3  Encounters:  05/22/24 128/78  08/28/23 128/66  04/01/22 109/74   Wt Readings from Last 3 Encounters:  05/22/24 201 lb (91.2 kg)  08/28/23 207 lb 9.6 oz (94.2 kg)  07/27/21 200 lb 3.2 oz (90.8 kg)    Physical Exam Vitals and nursing note reviewed.  Constitutional:      General: She is not in acute distress.    Appearance: Normal appearance.  HENT:     Head: Normocephalic.     Right Ear: Tympanic membrane, ear canal and external ear normal.     Left Ear: Tympanic membrane, ear canal and external ear normal.     Nose: Congestion present.     Mouth/Throat:     Pharynx: No oropharyngeal exudate or posterior oropharyngeal erythema.     Comments: Has postnasal drip Eyes:     Extraocular Movements: Extraocular movements intact.     Conjunctiva/sclera: Conjunctivae normal.     Pupils: Pupils are equal, round, and reactive to light.  Cardiovascular:     Rate and Rhythm: Normal rate and regular rhythm.     Heart sounds: Normal heart sounds.  Pulmonary:     Effort: Pulmonary effort is normal.     Breath sounds: Normal breath sounds. No wheezing or rhonchi.  Musculoskeletal:     Right lower leg: No edema.     Left lower leg: No edema.  Neurological:     General: No focal deficit present.     Mental Status: She is alert and oriented to person, place, and time.  Psychiatric:        Mood and Affect: Mood normal.        Behavior: Behavior normal.      Results for orders placed or performed in visit on 05/22/24  Influenza A and B Ag, Immunoassay  Result Value Ref Range   Source: NASAL    INFLUENZA A ANTIGEN NOT DETECTED NOT DETECTED   INFLUENZA B ANTIGEN NOT DETECTED NOT DETECTED  POC COVID-19  Result Value Ref Range   SARS Coronavirus 2 Ag Negative Negative            [1]  Social History Tobacco Use   Smoking status: Never   Smokeless tobacco: Never  Substance Use Topics   Alcohol use: Yes    Comment: OCCASIONAL   Drug use: No  [2]  Allergies Allergen  Reactions   Other Itching    Vick's vapor rub   Oxycodone -Acetaminophen  Itching   Promethazine  Other (See Comments)    Tardive dyskinesia   "
